# Patient Record
Sex: Female | Born: 1980 | Race: Black or African American | Hispanic: No | Marital: Married | Smoking: Current every day smoker
Health system: Southern US, Community
[De-identification: ages and names within clinical notes are randomized; demographics above are authoritative.]

## PROBLEM LIST (undated history)

## (undated) DIAGNOSIS — N83209 Unspecified ovarian cyst, unspecified side: Secondary | ICD-10-CM

## (undated) DIAGNOSIS — Z9851 Tubal ligation status: Secondary | ICD-10-CM

## (undated) DIAGNOSIS — R51 Headache: Secondary | ICD-10-CM

## (undated) DIAGNOSIS — O1490 Unspecified pre-eclampsia, unspecified trimester: Secondary | ICD-10-CM

## (undated) DIAGNOSIS — IMO0002 Reserved for concepts with insufficient information to code with codable children: Secondary | ICD-10-CM

## (undated) DIAGNOSIS — R87619 Unspecified abnormal cytological findings in specimens from cervix uteri: Secondary | ICD-10-CM

## (undated) HISTORY — DX: Unspecified abnormal cytological findings in specimens from cervix uteri: R87.619

## (undated) HISTORY — DX: Unspecified ovarian cyst, unspecified side: N83.209

## (undated) HISTORY — DX: Headache: R51

## (undated) HISTORY — DX: Reserved for concepts with insufficient information to code with codable children: IMO0002

---
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
## 1998-01-01 ENCOUNTER — Inpatient Hospital Stay (HOSPITAL_COMMUNITY): Admission: AD | Admit: 1998-01-01 | Discharge: 1998-01-01 | Payer: Self-pay | Admitting: Obstetrics & Gynecology

## 1998-05-09 ENCOUNTER — Inpatient Hospital Stay (HOSPITAL_COMMUNITY): Admission: AD | Admit: 1998-05-09 | Discharge: 1998-05-09 | Payer: Self-pay | Admitting: Obstetrics

## 1998-08-08 ENCOUNTER — Ambulatory Visit (HOSPITAL_COMMUNITY): Admission: RE | Admit: 1998-08-08 | Discharge: 1998-08-08 | Payer: Self-pay | Admitting: *Deleted

## 1998-10-13 ENCOUNTER — Inpatient Hospital Stay (HOSPITAL_COMMUNITY): Admission: AD | Admit: 1998-10-13 | Discharge: 1998-10-13 | Payer: Self-pay | Admitting: *Deleted

## 1998-10-15 ENCOUNTER — Inpatient Hospital Stay (HOSPITAL_COMMUNITY): Admission: AD | Admit: 1998-10-15 | Discharge: 1998-10-15 | Payer: Self-pay | Admitting: Obstetrics

## 1998-10-31 ENCOUNTER — Inpatient Hospital Stay (HOSPITAL_COMMUNITY): Admission: AD | Admit: 1998-10-31 | Discharge: 1998-10-31 | Payer: Self-pay | Admitting: Obstetrics

## 1998-11-21 ENCOUNTER — Inpatient Hospital Stay (HOSPITAL_COMMUNITY): Admission: AD | Admit: 1998-11-21 | Discharge: 1998-11-21 | Payer: Self-pay | Admitting: *Deleted

## 1998-12-17 ENCOUNTER — Inpatient Hospital Stay (HOSPITAL_COMMUNITY): Admission: AD | Admit: 1998-12-17 | Discharge: 1998-12-17 | Payer: Self-pay | Admitting: Obstetrics

## 1998-12-20 ENCOUNTER — Inpatient Hospital Stay (HOSPITAL_COMMUNITY): Admission: AD | Admit: 1998-12-20 | Discharge: 1998-12-23 | Payer: Self-pay | Admitting: Obstetrics

## 1998-12-26 ENCOUNTER — Inpatient Hospital Stay (HOSPITAL_COMMUNITY): Admission: AD | Admit: 1998-12-26 | Discharge: 1998-12-28 | Payer: Self-pay | Admitting: Obstetrics

## 1998-12-26 ENCOUNTER — Inpatient Hospital Stay (HOSPITAL_COMMUNITY): Admission: AD | Admit: 1998-12-26 | Discharge: 1998-12-26 | Payer: Self-pay | Admitting: Obstetrics

## 1999-04-23 ENCOUNTER — Encounter: Payer: Self-pay | Admitting: Emergency Medicine

## 1999-04-23 ENCOUNTER — Emergency Department (HOSPITAL_COMMUNITY): Admission: EM | Admit: 1999-04-23 | Discharge: 1999-04-23 | Payer: Self-pay | Admitting: Emergency Medicine

## 1999-11-20 ENCOUNTER — Ambulatory Visit (HOSPITAL_COMMUNITY): Admission: RE | Admit: 1999-11-20 | Discharge: 1999-11-20 | Payer: Self-pay | Admitting: *Deleted

## 1999-11-20 ENCOUNTER — Encounter: Payer: Self-pay | Admitting: *Deleted

## 1999-12-01 ENCOUNTER — Inpatient Hospital Stay (HOSPITAL_COMMUNITY): Admission: AD | Admit: 1999-12-01 | Discharge: 1999-12-01 | Payer: Self-pay | Admitting: Obstetrics

## 2000-01-10 ENCOUNTER — Ambulatory Visit (HOSPITAL_COMMUNITY): Admission: RE | Admit: 2000-01-10 | Discharge: 2000-01-10 | Payer: Self-pay | Admitting: Obstetrics

## 2000-04-02 ENCOUNTER — Inpatient Hospital Stay (HOSPITAL_COMMUNITY): Admission: AD | Admit: 2000-04-02 | Discharge: 2000-04-02 | Payer: Self-pay | Admitting: Obstetrics

## 2000-04-11 ENCOUNTER — Emergency Department (HOSPITAL_COMMUNITY): Admission: EM | Admit: 2000-04-11 | Discharge: 2000-04-12 | Payer: Self-pay

## 2000-05-21 ENCOUNTER — Inpatient Hospital Stay (HOSPITAL_COMMUNITY): Admission: AD | Admit: 2000-05-21 | Discharge: 2000-05-21 | Payer: Self-pay | Admitting: *Deleted

## 2000-05-27 ENCOUNTER — Inpatient Hospital Stay (HOSPITAL_COMMUNITY): Admission: AD | Admit: 2000-05-27 | Discharge: 2000-05-29 | Payer: Self-pay | Admitting: *Deleted

## 2000-08-13 ENCOUNTER — Inpatient Hospital Stay (HOSPITAL_COMMUNITY): Admission: AD | Admit: 2000-08-13 | Discharge: 2000-08-13 | Payer: Self-pay | Admitting: Obstetrics & Gynecology

## 2001-02-03 ENCOUNTER — Inpatient Hospital Stay (HOSPITAL_COMMUNITY): Admission: AD | Admit: 2001-02-03 | Discharge: 2001-02-03 | Payer: Self-pay | Admitting: Obstetrics

## 2001-04-02 ENCOUNTER — Emergency Department (HOSPITAL_COMMUNITY): Admission: EM | Admit: 2001-04-02 | Discharge: 2001-04-02 | Payer: Self-pay | Admitting: Emergency Medicine

## 2002-03-08 ENCOUNTER — Inpatient Hospital Stay (HOSPITAL_COMMUNITY): Admission: AD | Admit: 2002-03-08 | Discharge: 2002-03-08 | Payer: Self-pay | Admitting: *Deleted

## 2002-08-10 ENCOUNTER — Inpatient Hospital Stay (HOSPITAL_COMMUNITY): Admission: AD | Admit: 2002-08-10 | Discharge: 2002-08-10 | Payer: Self-pay | Admitting: Obstetrics and Gynecology

## 2002-08-16 ENCOUNTER — Inpatient Hospital Stay (HOSPITAL_COMMUNITY): Admission: AD | Admit: 2002-08-16 | Discharge: 2002-08-16 | Payer: Self-pay | Admitting: *Deleted

## 2002-08-16 ENCOUNTER — Encounter: Payer: Self-pay | Admitting: *Deleted

## 2002-08-17 ENCOUNTER — Encounter (INDEPENDENT_AMBULATORY_CARE_PROVIDER_SITE_OTHER): Payer: Self-pay | Admitting: Specialist

## 2002-08-17 ENCOUNTER — Ambulatory Visit (HOSPITAL_COMMUNITY): Admission: RE | Admit: 2002-08-17 | Discharge: 2002-08-17 | Payer: Self-pay | Admitting: Obstetrics and Gynecology

## 2002-08-20 ENCOUNTER — Inpatient Hospital Stay (HOSPITAL_COMMUNITY): Admission: AD | Admit: 2002-08-20 | Discharge: 2002-08-20 | Payer: Self-pay | Admitting: *Deleted

## 2003-09-12 ENCOUNTER — Inpatient Hospital Stay (HOSPITAL_COMMUNITY): Admission: AD | Admit: 2003-09-12 | Discharge: 2003-09-12 | Payer: Self-pay | Admitting: *Deleted

## 2004-08-03 ENCOUNTER — Inpatient Hospital Stay (HOSPITAL_COMMUNITY): Admission: AD | Admit: 2004-08-03 | Discharge: 2004-08-03 | Payer: Self-pay | Admitting: Obstetrics and Gynecology

## 2006-09-16 ENCOUNTER — Ambulatory Visit (HOSPITAL_COMMUNITY): Admission: RE | Admit: 2006-09-16 | Discharge: 2006-09-16 | Payer: Self-pay | Admitting: Obstetrics

## 2006-10-24 ENCOUNTER — Inpatient Hospital Stay (HOSPITAL_COMMUNITY): Admission: AD | Admit: 2006-10-24 | Discharge: 2006-10-24 | Payer: Self-pay | Admitting: Obstetrics & Gynecology

## 2007-01-01 ENCOUNTER — Ambulatory Visit (HOSPITAL_COMMUNITY): Admission: RE | Admit: 2007-01-01 | Discharge: 2007-01-01 | Payer: Self-pay | Admitting: Obstetrics

## 2007-01-14 ENCOUNTER — Inpatient Hospital Stay (HOSPITAL_COMMUNITY): Admission: AD | Admit: 2007-01-14 | Discharge: 2007-01-14 | Payer: Self-pay | Admitting: Obstetrics & Gynecology

## 2007-03-08 ENCOUNTER — Inpatient Hospital Stay (HOSPITAL_COMMUNITY): Admission: RE | Admit: 2007-03-08 | Discharge: 2007-03-10 | Payer: Self-pay | Admitting: Obstetrics & Gynecology

## 2007-06-26 ENCOUNTER — Inpatient Hospital Stay (HOSPITAL_COMMUNITY): Admission: AD | Admit: 2007-06-26 | Discharge: 2007-06-27 | Payer: Self-pay | Admitting: Obstetrics

## 2007-08-19 ENCOUNTER — Inpatient Hospital Stay (HOSPITAL_COMMUNITY): Admission: AD | Admit: 2007-08-19 | Discharge: 2007-08-19 | Payer: Self-pay | Admitting: Obstetrics & Gynecology

## 2007-09-20 ENCOUNTER — Emergency Department (HOSPITAL_COMMUNITY): Admission: EM | Admit: 2007-09-20 | Discharge: 2007-09-20 | Payer: Self-pay | Admitting: Emergency Medicine

## 2007-11-17 ENCOUNTER — Emergency Department (HOSPITAL_COMMUNITY): Admission: EM | Admit: 2007-11-17 | Discharge: 2007-11-17 | Payer: Self-pay | Admitting: Podiatry

## 2008-08-02 ENCOUNTER — Emergency Department (HOSPITAL_COMMUNITY): Admission: EM | Admit: 2008-08-02 | Discharge: 2008-08-02 | Payer: Self-pay | Admitting: Emergency Medicine

## 2008-09-21 ENCOUNTER — Inpatient Hospital Stay (HOSPITAL_COMMUNITY): Admission: AD | Admit: 2008-09-21 | Discharge: 2008-09-22 | Payer: Self-pay | Admitting: Obstetrics & Gynecology

## 2008-10-19 ENCOUNTER — Emergency Department (HOSPITAL_COMMUNITY): Admission: EM | Admit: 2008-10-19 | Discharge: 2008-10-19 | Payer: Self-pay | Admitting: Emergency Medicine

## 2008-11-05 IMAGING — US US TRANSVAGINAL NON-OB
1 series · 13 of 25 positions shown · non-contrast
Comparison: none

CLINICAL DATA: Abdominal and pelvic pain.  LMP 08/02/07.
 TRANSABDOMINAL AND TRANSVAGINAL PELVIC ULTRASOUND:
TECHNIQUE: Both transabdominal and transvaginal ultrasound examinations of the pelvis were performed including evaluation of the uterus, ovaries, adnexal regions, and pelvic cul-de-sac.

[Series 1: us transvaginal non-ob · 0.21mm/px · 13 of 56 slices shown]
[im 1/56]
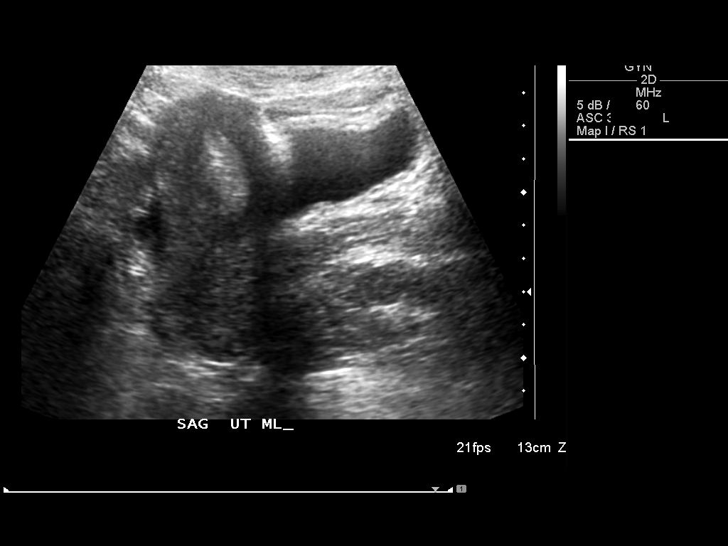
[im 5/56]
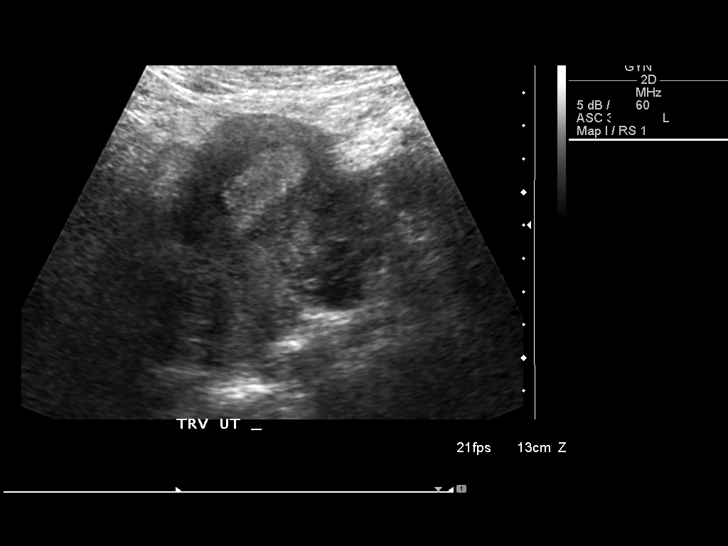
[im 10/56]
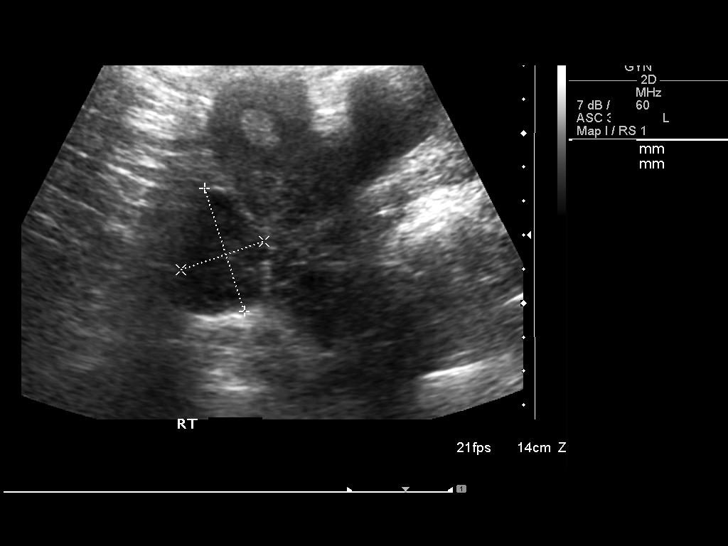
[im 14/56]
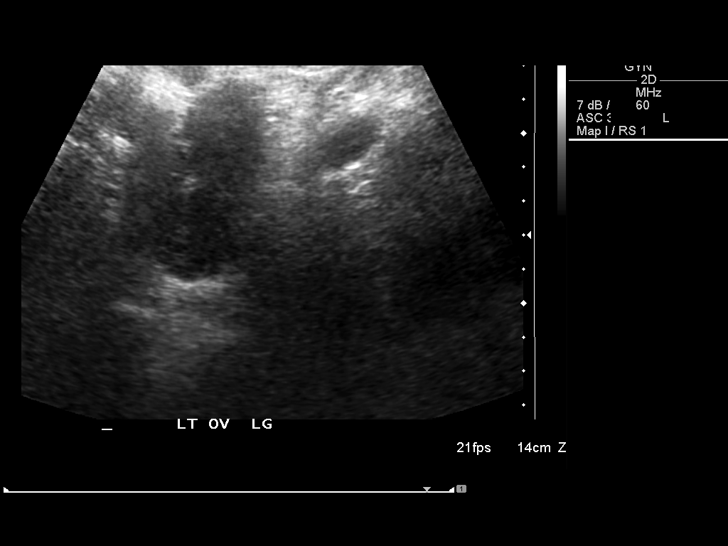
[im 19/56]
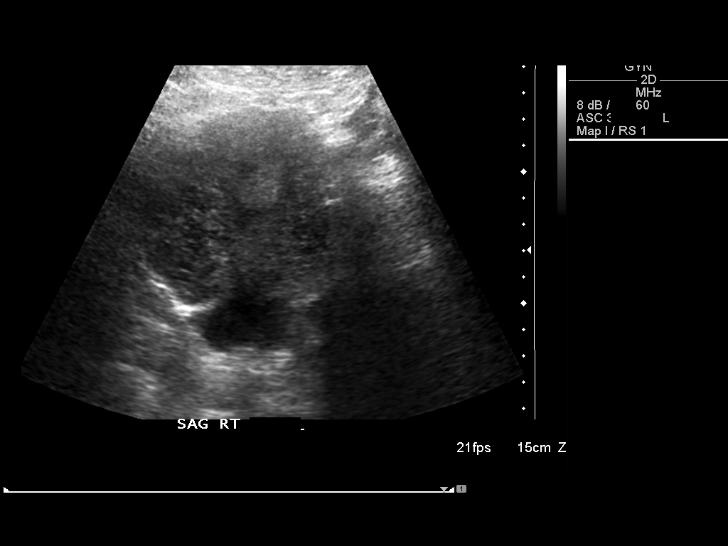
[im 23/56]
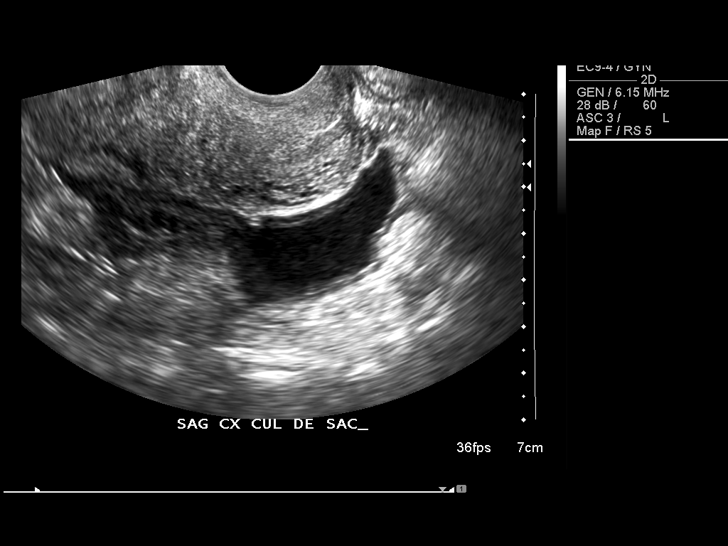
[im 28/56]
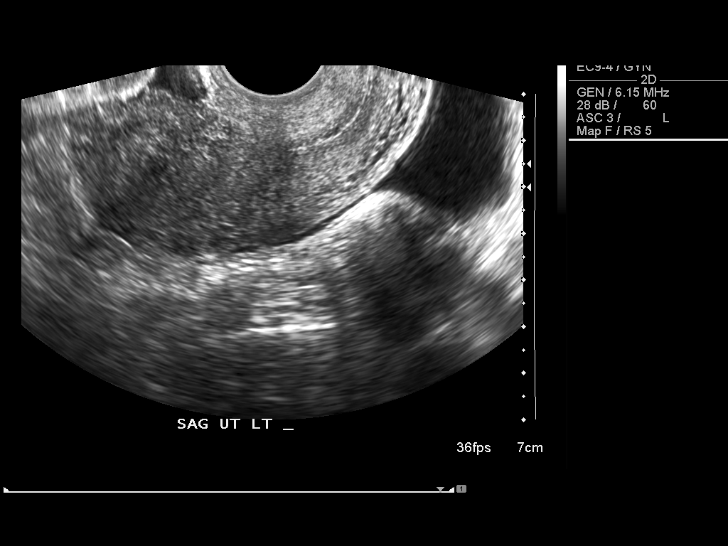
[im 33/56]
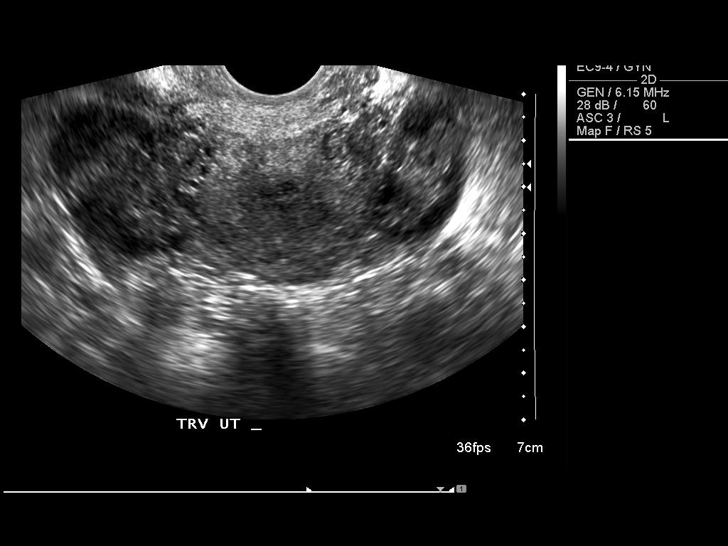
[im 37/56]
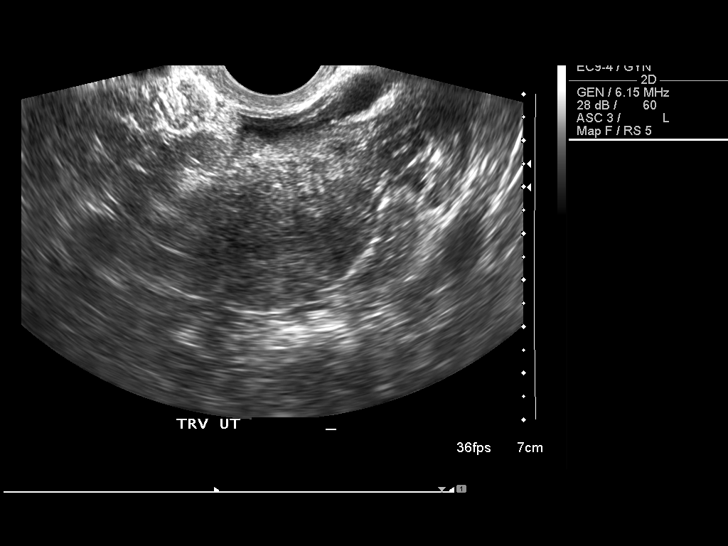
[im 42/56]
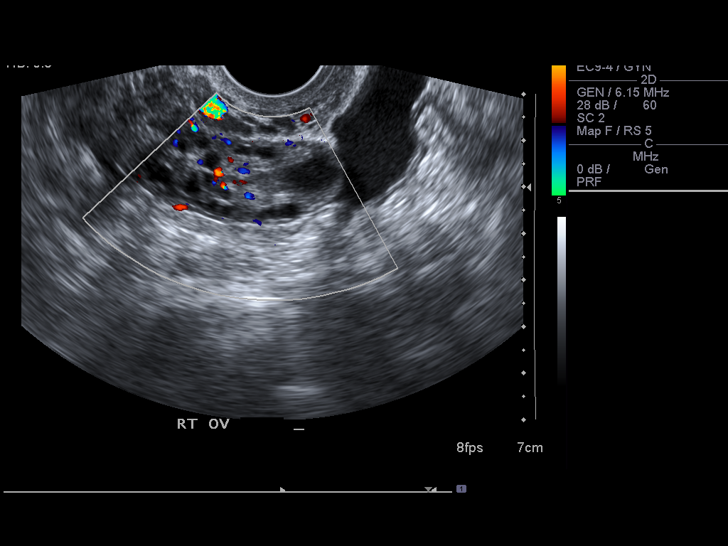
[im 46/56]
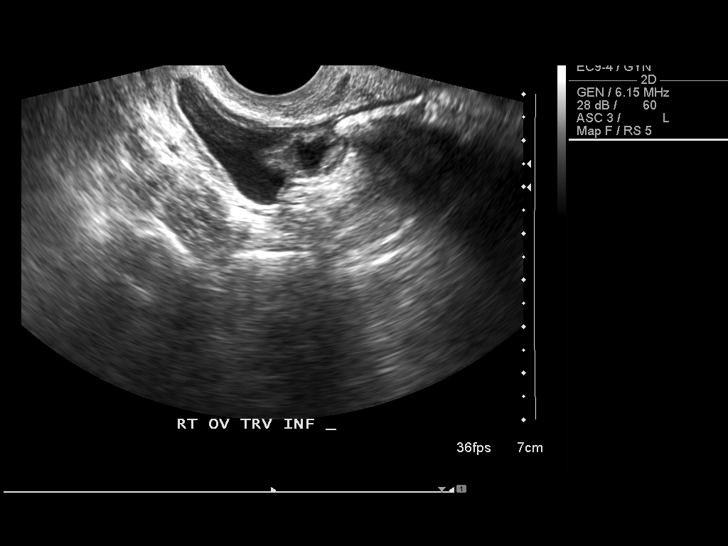
[im 51/56]
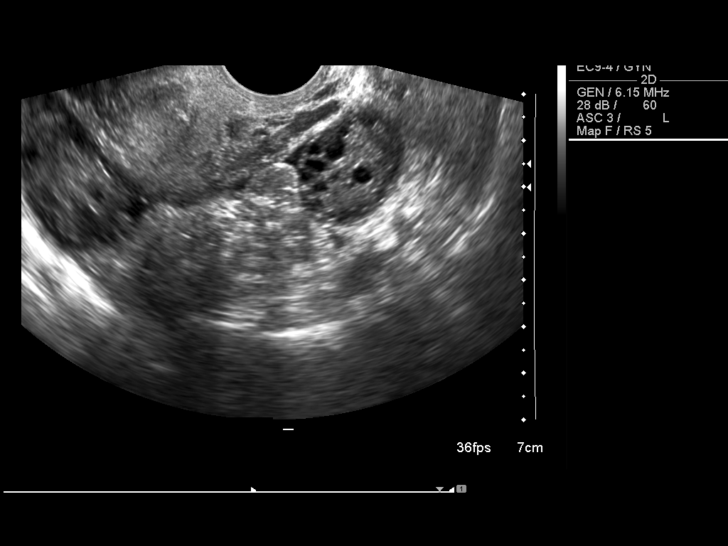
[im 56/56]
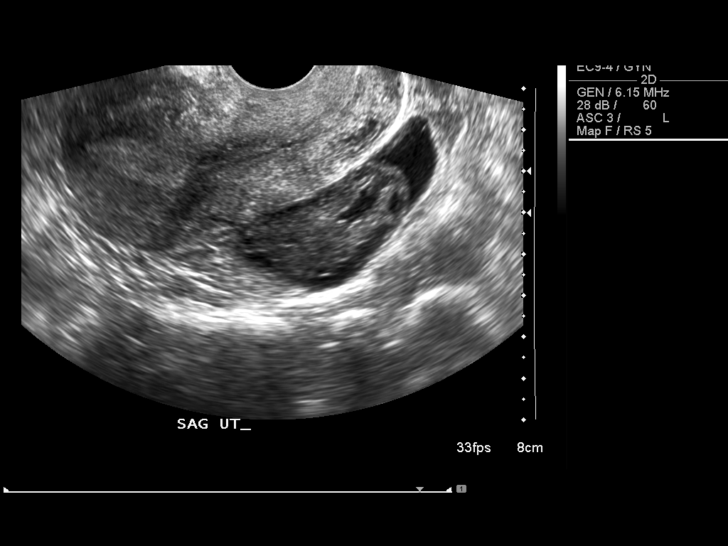

[13 of 25 positions shown; findings below may reference images not displayed]

FINDINGS: The uterus has a sagittal length of 8.5 cm, an AP width of 3.5 cm, and a transverse width of 4.9 cm.  A homogeneous uterine myometrium is seen.  The endometrial stripe is trilayered with an AP width of 1.0 cm.  No areas of focal thickening or inhomogeneity are noted and this would correlate with a periovulatory endometrial stripe and the patient?s given LMP of 08/19/07.  
 The right ovary measures 4.6 x 2.4 x 3.2 cm and contains a collapsing corpus luteum cyst.  There is a small amount of complex fluid identified adjacent to the right ovary suggesting that there may have been rupture of this corpus luteum cyst with some associated hemoperitoneum.  
 The left ovary has a normal appearance measuring 3.4 x 1.9 x 2.3 cm.  No separate adnexal masses are noted.
IMPRESSION: Normal periovulatory uterine myometrium, endometrium, and ovaries.  Small amount of complex free fluid is identified in a right paraovarian position extending to the cul-de-sac and given the presence of a collapsing corpus luteum cyst may represent some leakage or rupture from this cyst in light of the patient?s history of pain.
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
## 2009-02-05 ENCOUNTER — Inpatient Hospital Stay (HOSPITAL_COMMUNITY): Admission: AD | Admit: 2009-02-05 | Discharge: 2009-02-05 | Payer: Self-pay | Admitting: Obstetrics & Gynecology

## 2009-09-10 ENCOUNTER — Inpatient Hospital Stay (HOSPITAL_COMMUNITY): Admission: AD | Admit: 2009-09-10 | Discharge: 2009-09-10 | Payer: Self-pay | Admitting: Obstetrics & Gynecology

## 2009-09-24 ENCOUNTER — Inpatient Hospital Stay (HOSPITAL_COMMUNITY): Admission: AD | Admit: 2009-09-24 | Discharge: 2009-09-24 | Payer: Self-pay | Admitting: Family Medicine

## 2010-07-29 ENCOUNTER — Emergency Department (HOSPITAL_COMMUNITY): Admission: EM | Admit: 2010-07-29 | Discharge: 2010-07-29 | Payer: Self-pay | Admitting: Emergency Medicine

## 2011-02-19 LAB — URINALYSIS, ROUTINE W REFLEX MICROSCOPIC
Bilirubin Urine: NEGATIVE
Glucose, UA: NEGATIVE mg/dL
Hgb urine dipstick: NEGATIVE
Ketones, ur: NEGATIVE mg/dL
Protein, ur: NEGATIVE mg/dL
Specific Gravity, Urine: 1.01 (ref 1.005–1.030)
Urobilinogen, UA: 0.2 mg/dL (ref 0.0–1.0)
pH: 7.5 (ref 5.0–8.0)

## 2011-02-19 LAB — WET PREP, GENITAL: Trich, Wet Prep: NONE SEEN

## 2011-02-20 LAB — WET PREP, GENITAL: Trich, Wet Prep: NONE SEEN

## 2011-02-27 LAB — URINALYSIS, ROUTINE W REFLEX MICROSCOPIC
Nitrite: NEGATIVE
Protein, ur: NEGATIVE mg/dL
Specific Gravity, Urine: 1.025 (ref 1.005–1.030)
Urobilinogen, UA: 1 mg/dL (ref 0.0–1.0)

## 2011-02-27 LAB — WET PREP, GENITAL
Clue Cells Wet Prep HPF POC: NONE SEEN
Trich, Wet Prep: NONE SEEN

## 2011-04-04 NOTE — Op Note (Signed)
   Stacey Sparks, Stacey Sparks                       ACCOUNT NO.:  [id number]   MEDICAL RECORD NO.:  [id number]                   PATIENT TYPE:  AMB   LOCATION:  SDC                                  FACILITY:  [facility]   PHYSICIAN:  Sherry A. Rosalio Macadamia, M.D.           DATE OF BIRTH:  03/10/1981   DATE OF PROCEDURE:  08/17/2002  DATE OF DISCHARGE:                                 OPERATIVE REPORT   PREOPERATIVE DIAGNOSES:  Missed abortion.   POSTOPERATIVE DIAGNOSES:  Missed abortion.   PROCEDURE:  Dictation ended at this point.                                               Sherry A. Rosalio Macadamia, M.D.    SAD/MEDQ  D:  08/17/2002  T:  08/17/2002  Job:  [id number]

## 2011-04-04 NOTE — H&P (Signed)
NAMECINDEE, Stacey Sparks             ACCOUNT NO.:  [id number]   MEDICAL RECORD NO.:  [id number]          PATIENT TYPE:  INP   LOCATION:  9174                          FACILITY:  [facility]   PHYSICIAN:  Roseanna Rainbow, M.D.DATE OF BIRTH:  [birth year]   DATE OF ADMISSION:  03/08/2007  DATE OF DISCHARGE:                              HISTORY & PHYSICAL   CHIEF COMPLAINT:  The patient is a gravida 6, para 2, with an estimated  date of confinement of March 02, 2007, with an intrauterine pregnancy at  41 weeks, for induction of labor secondary to post dates.   HISTORY OF PRESENT ILLNESS:  Please note, the patient reports regular  contractions for several hours.   ALLERGIES:  No known drug allergies.   OB RISK FACTORS:  None.   PRENATAL LABS:  Hemoglobin 12.4, hematocrit 38.2, platelets 291,000.  Blood type O positive, antibody screen negative.  Sickle cell negative.  RPR nonreactive.  Hepatitis B surface antigen negative.  HIV  nonreactive.  Rubella immune.  Urine culture and sensitivity:  No  growth.  Pap smear negative.  GC probe negative.  Chlamydia probe  negative.  Quad screen normal.  One-hour GTT is 109.   PAST GYN HISTORY:  Noncontributory.   PAST MEDICAL HISTORY:  No significant history of medical disease.   PAST SURGICAL HISTORY:  Orthopedic procedure.   SOCIAL HISTORY:  She is employed at [employer] work.  Marital history:  She is single.  Not using alcohol currently.  Previous smoked.  Denies  illicit drug use.   FAMILY HISTORY:  Adult-onset diabetes, hypertension.   PAST OBSTETRICAL HISTORY:  In June 2005, she had a voluntary termination  of pregnancy.  In July 2001, she had a full-term vaginal delivery of  female 7 pounds 6 ounces.  In February 2000, she was delivered of a  liveborn female 7 pounds 8 ounces vaginal delivery, complicated by  preeclampsia.  She has a history of 2 spontaneous abortions.   PHYSICAL EXAMINATION:  VITAL SIGNS:  Stable, afebrile.  Fetal  heart  tracing reassuring.  Tocodynamometer:  Regular uterine contractions.  PELVIC:  Sterile vaginal exam:  The cervix is 4 cm dilated, 80% effaced.  The membranes were artificially ruptured for clear fluid.   ASSESSMENT:  Multipara at 41+ weeks, late in labor.  Fetal heart tracing  consistent with fetal wellbeing.   PLAN:  Admit.  Continue augmentation of labor.      Roseanna Rainbow, M.D.  Electronically Signed     LAJ/MEDQ  D:  03/08/2007  T:  03/08/2007  Job:  [id number]

## 2011-04-04 NOTE — Op Note (Signed)
[name], Stacey Sparks                       ACCOUNT NO.:  [id number]   MEDICAL RECORD NO.:  [id number]                   PATIENT TYPE:  AMB   LOCATION:  SDC                                  FACILITY:  [facility]   PHYSICIAN:  Sherry A. Rosalio Macadamia, M.D.           DATE OF BIRTH:  [birth year]   DATE OF PROCEDURE:  08/17/2002  DATE OF DISCHARGE:                                 OPERATIVE REPORT   PREOPERATIVE DIAGNOSES:  Missed abortion.   POSTOPERATIVE DIAGNOSES:  Missed abortion.   PROCEDURE:  Dilatation and evacuation.   SURGEON:  Sherry A. Rosalio Macadamia, M.D.   ANESTHESIA:  MAC.   INDICATIONS:  This is a 30 year old G5, P2-0-2-2 woman whose last menstrual  period was approximately July 28.  The patient was not using any form of  contraception.  The patient noted onset of bleeding and cramping on  September 30.  The patient had not passed any tissue.  She was seen in the  emergency room at [facility].  Ultrasound was performed which revealed  a 6 week sac that was irregular with no fetal pole, but with a yolk sac  present.  Therefore, diagnosis of missed abortion was made.  The patient was  given a choice of attempting to pass tissue on her own or having a surgery.  She elected to go home to try to pass it on her own; however, her cramping  continued with no passage of tissue.  The patient had been scheduled for  surgery today if she did not complete the miscarriage on her own.  Therefore, the patient is now admitted to the operating room for D&E.   FINDINGS:  A 6-7 week sized anteflexed uterus with no adnexal masses.  Cervix was dilated approximately 1 cm.   PROCEDURE:  The patient was brought into the operating room and given  adequate IV sedation.  She was placed in a dorsal lithotomy position.  Pelvic examination was performed.  The patient's perineum was washed with  Hibiclens.  Bladder was I&O catheterized.  Surgeon's gloves were changed.  The patient was draped in a  sterile fashion.  Speculum was placed within the  vagina.  Vagina was washed with Hibiclens.  Paracervical block was  administered with 1% Nesacaine.  Anterior lip of the cervix was grasped with  a single tooth tenaculum.  Cervix was sounded.  No dilating was necessary.  A 7 mm curved curette was easily introduced into the endometrial cavity.  Suction was applied.  Suction was continued with products of conception  obtained.  This was continued until adequate hemostasis was present.  Just a  very brief, sharp curettage was performed with no tissue present.  Suction  was reapplied.  Adequate hemostasis was present.  All instruments removed  from vagina.  The patient was taken out of the dorsal lithotomy position.  She was awakened.  She was moved from the operating table to a stretcher in  stable condition.   COMPLICATIONS:  None.   ESTIMATED BLOOD LOSS:  Less than 5 cc.                                              Sherry A. Rosalio Macadamia, M.D.   SAD/MEDQ  D:  08/17/2002  T:  08/17/2002  Job:  [id number]

## 2011-06-17 ENCOUNTER — Other Ambulatory Visit (HOSPITAL_COMMUNITY): Payer: Self-pay | Admitting: Obstetrics and Gynecology

## 2011-06-17 DIAGNOSIS — O3680X0 Pregnancy with inconclusive fetal viability, not applicable or unspecified: Secondary | ICD-10-CM

## 2011-06-20 ENCOUNTER — Ambulatory Visit (HOSPITAL_COMMUNITY)
Admission: RE | Admit: 2011-06-20 | Discharge: 2011-06-20 | Disposition: A | Discharge status: Home / Self Care | Payer: Medicaid Other | Source: Ambulatory Visit | Attending: Obstetrics and Gynecology | Admitting: Obstetrics and Gynecology

## 2011-06-20 DIAGNOSIS — O3680X0 Pregnancy with inconclusive fetal viability, not applicable or unspecified: Secondary | ICD-10-CM

## 2011-06-20 DIAGNOSIS — Z3689 Encounter for other specified antenatal screening: Secondary | ICD-10-CM | POA: Insufficient documentation

## 2011-07-09 LAB — ANTIBODY SCREEN: Antibody Screen: NEGATIVE

## 2011-07-09 LAB — ABO/RH: RH Type: POSITIVE

## 2011-07-09 LAB — RUBELLA ANTIBODY, IGM: Rubella: IMMUNE

## 2011-07-09 LAB — RPR: RPR: NONREACTIVE

## 2011-07-09 LAB — GC/CHLAMYDIA PROBE AMP, GENITAL: Chlamydia: NEGATIVE

## 2011-07-09 LAB — HEPATITIS B SURFACE ANTIGEN: Hepatitis B Surface Ag: NEGATIVE

## 2011-08-18 LAB — CBC
HCT: 35.9 — ABNORMAL LOW
Hemoglobin: 12.3
MCV: 101.2 — ABNORMAL HIGH
RBC: 3.55 — ABNORMAL LOW

## 2011-08-18 LAB — URINALYSIS, ROUTINE W REFLEX MICROSCOPIC
Bilirubin Urine: NEGATIVE
Ketones, ur: NEGATIVE
Nitrite: NEGATIVE
Protein, ur: NEGATIVE
Specific Gravity, Urine: 1.032 — ABNORMAL HIGH
Urobilinogen, UA: 1

## 2011-08-18 LAB — DIFFERENTIAL
Lymphocytes Relative: 37
Lymphs Abs: 1.8
Monocytes Absolute: 0.6
Neutro Abs: 2.1

## 2011-08-18 LAB — COMPREHENSIVE METABOLIC PANEL
ALT: 21
AST: 19
Albumin: 3.7
Creatinine, Ser: 0.9
GFR calc Af Amer: >60
Glucose, Bld: 84
Potassium: 3.9
Sodium: 140
Total Bilirubin: 0.6

## 2011-08-20 LAB — URINALYSIS, ROUTINE W REFLEX MICROSCOPIC
Bilirubin Urine: NEGATIVE
Glucose, UA: 250 — AB
Ketones, ur: 15 — AB
Nitrite: POSITIVE — AB
Protein, ur: >300 — AB
Specific Gravity, Urine: 1.025

## 2011-08-20 LAB — POCT PREGNANCY, URINE: Preg Test, Ur: NEGATIVE

## 2011-08-20 LAB — WET PREP, GENITAL
Clue Cells Wet Prep HPF POC: NONE SEEN
Trich, Wet Prep: NONE SEEN
Yeast Wet Prep HPF POC: NONE SEEN

## 2011-08-28 LAB — URINALYSIS, ROUTINE W REFLEX MICROSCOPIC
Bilirubin Urine: NEGATIVE
Glucose, UA: NEGATIVE
Hgb urine dipstick: NEGATIVE
Ketones, ur: NEGATIVE
Protein, ur: NEGATIVE
pH: 5.5

## 2011-08-28 LAB — WET PREP, GENITAL
Clue Cells Wet Prep HPF POC: NONE SEEN
Trich, Wet Prep: NONE SEEN
Yeast Wet Prep HPF POC: NONE SEEN

## 2011-08-28 LAB — CBC
HCT: 35.4 — ABNORMAL LOW
Hemoglobin: 12.2
MCHC: 34.5
RBC: 3.58 — ABNORMAL LOW

## 2011-08-28 LAB — GC/CHLAMYDIA PROBE AMP, GENITAL
Chlamydia, DNA Probe: NEGATIVE
GC Probe Amp, Genital: NEGATIVE

## 2011-08-28 LAB — HERPES SIMPLEX VIRUS CULTURE: Culture: NOT DETECTED

## 2011-09-01 LAB — GC/CHLAMYDIA PROBE AMP, GENITAL
Chlamydia, DNA Probe: NEGATIVE
GC Probe Amp, Genital: NEGATIVE

## 2011-09-01 LAB — URINALYSIS, ROUTINE W REFLEX MICROSCOPIC
Glucose, UA: NEGATIVE
Hgb urine dipstick: NEGATIVE
Protein, ur: NEGATIVE

## 2011-09-01 LAB — WET PREP, GENITAL
Clue Cells Wet Prep HPF POC: NONE SEEN
Trich, Wet Prep: NONE SEEN

## 2011-09-01 LAB — POCT PREGNANCY, URINE: Operator id: 202651

## 2011-11-18 NOTE — L&D Delivery Note (Signed)
Delivery Note At 7:38 PM a viable female was delivered via Vaginal, Spontaneous Delivery (Presentation:OA compound with hand).  APGAR: 8, 9; weight 8 lb 2.3 oz (3694 g).   Placenta status: Intact, Spontaneous.  Cord: 3 vessels with the following complications: Nuchal.    Anesthesia: Epidural  Episiotomy: None Lacerations: B labial, R repaired, L hemostatic Suture Repair: 3.0 vicryl rapide Est. Blood Loss (mL): 500 L labial skin tag excised with scalpel, single suture at base for hemostasis, sent to pathology  Mom to postpartum.  Baby to nursery-stable.  Sparks,Stacey Sperbeck 01/23/2012, 8:07 PM   O+/Bo/RI Desires circumcision for female infant, d/w pt r/b/a of procedure, wish to proceed at office. Pt desires PPBTL, d/w pt other reversible forms of contraception, pt desires.  D/w pt r/b/a will proceed in am, d/w pt increased risk of ectopic pregnancy with failure 1/200

## 2011-12-29 LAB — STREP B DNA PROBE: GBS: POSITIVE

## 2012-01-21 ENCOUNTER — Encounter (HOSPITAL_COMMUNITY): Payer: Self-pay | Admitting: *Deleted

## 2012-01-21 ENCOUNTER — Telehealth (HOSPITAL_COMMUNITY): Payer: Self-pay | Admitting: *Deleted

## 2012-01-21 NOTE — Telephone Encounter (Signed)
Preadmission screen  

## 2012-01-22 ENCOUNTER — Other Ambulatory Visit: Payer: Self-pay | Admitting: Obstetrics and Gynecology
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
## 2012-01-23 ENCOUNTER — Encounter (HOSPITAL_COMMUNITY): Payer: Self-pay

## 2012-01-23 ENCOUNTER — Inpatient Hospital Stay (HOSPITAL_COMMUNITY)
Admission: RE | Admit: 2012-01-23 | Discharge: 2012-01-25 | DRG: 775 | Disposition: A | Discharge status: Home / Self Care | Payer: Medicaid Other | Source: Ambulatory Visit | Attending: Obstetrics and Gynecology | Admitting: Obstetrics and Gynecology

## 2012-01-23 DIAGNOSIS — O99892 Other specified diseases and conditions complicating childbirth: Secondary | ICD-10-CM | POA: Diagnosis present

## 2012-01-23 DIAGNOSIS — Z9851 Tubal ligation status: Secondary | ICD-10-CM

## 2012-01-23 DIAGNOSIS — L919 Hypertrophic disorder of the skin, unspecified: Secondary | ICD-10-CM | POA: Diagnosis present

## 2012-01-23 DIAGNOSIS — L909 Atrophic disorder of skin, unspecified: Secondary | ICD-10-CM | POA: Diagnosis present

## 2012-01-23 HISTORY — DX: Tubal ligation status: Z98.51

## 2012-01-23 LAB — CBC
MCV: 102.3 fL — ABNORMAL HIGH (ref 78.0–100.0)
Platelets: 231 10*3/uL (ref 150–400)
RBC: 3.04 MIL/uL — ABNORMAL LOW (ref 3.87–5.11)
RDW: 14.2 % (ref 11.5–15.5)
WBC: 8.7 10*3/uL (ref 4.0–10.5)

## 2012-01-23 LAB — ABO/RH: ABO/RH(D): O POS

## 2012-01-23 MED ORDER — BUTORPHANOL TARTRATE 2 MG/ML IJ SOLN: 2.0000 mg | INTRAMUSCULAR | Status: DC | PRN

## 2012-01-23 MED ORDER — DIPHENHYDRAMINE HCL 25 MG PO CAPS: 25.0000 mg | ORAL_CAPSULE | Freq: Four times a day (QID) | ORAL | Status: DC | PRN

## 2012-01-23 MED ORDER — FENTANYL 2.5 MCG/ML BUPIVACAINE 1/10 % EPIDURAL INFUSION (WH - ANES)
INTRAMUSCULAR | Status: DC | PRN
  Administered 2012-01-23: 14 mL/h via EPIDURAL

## 2012-01-23 MED ORDER — OXYTOCIN BOLUS FROM INFUSION
500.0000 mL | Freq: Once | INTRAVENOUS | Status: DC
  Filled 2012-01-23: qty 500
  Filled 2012-01-23: qty 1000

## 2012-01-23 MED ORDER — LACTATED RINGERS IV SOLN
500.0000 mL | Freq: Once | INTRAVENOUS | Status: AC
  Administered 2012-01-23: 500 mL via INTRAVENOUS

## 2012-01-23 MED ORDER — CITRIC ACID-SODIUM CITRATE 334-500 MG/5ML PO SOLN: 30.0000 mL | ORAL | Status: DC | PRN

## 2012-01-23 MED ORDER — LACTATED RINGERS IV SOLN
500.0000 mL | INTRAVENOUS | Status: DC | PRN
  Administered 2012-01-23: 200 mL via INTRAVENOUS

## 2012-01-23 MED ORDER — LANOLIN HYDROUS EX OINT: TOPICAL_OINTMENT | CUTANEOUS | Status: DC | PRN

## 2012-01-23 MED ORDER — ZOLPIDEM TARTRATE 5 MG PO TABS
5.0000 mg | ORAL_TABLET | Freq: Every evening | ORAL | Status: DC | PRN
Start: 2012-01-23 — End: 2012-01-25

## 2012-01-23 MED ORDER — DIBUCAINE 1 % RE OINT: 1.0000 "application " | TOPICAL_OINTMENT | RECTAL | Status: DC | PRN

## 2012-01-23 MED ORDER — PENICILLIN G POTASSIUM 5000000 UNITS IJ SOLR
2.5000 10*6.[IU] | INTRAVENOUS | Status: DC
  Administered 2012-01-23 (×2): 2.5 10*6.[IU] via INTRAVENOUS
  Filled 2012-01-23 (×5): qty 2.5

## 2012-01-23 MED ORDER — IBUPROFEN 600 MG PO TABS
600.0000 mg | ORAL_TABLET | Freq: Four times a day (QID) | ORAL | Status: DC
  Administered 2012-01-24 – 2012-01-25 (×4): 600 mg via ORAL
  Filled 2012-01-23 (×4): qty 1

## 2012-01-23 MED ORDER — PHENYLEPHRINE 40 MCG/ML (10ML) SYRINGE FOR IV PUSH (FOR BLOOD PRESSURE SUPPORT)
80.0000 ug | PREFILLED_SYRINGE | INTRAVENOUS | Status: DC | PRN
  Administered 2012-01-23: 80 ug via INTRAVENOUS
  Filled 2012-01-23: qty 5

## 2012-01-23 MED ORDER — TETANUS-DIPHTH-ACELL PERTUSSIS 5-2.5-18.5 LF-MCG/0.5 IM SUSP
0.5000 mL | Freq: Once | INTRAMUSCULAR | Status: AC
  Administered 2012-01-25: 0.5 mL via INTRAMUSCULAR
  Filled 2012-01-23: qty 0.5

## 2012-01-23 MED ORDER — BENZOCAINE-MENTHOL 20-0.5 % EX AERO: 1.0000 "application " | INHALATION_SPRAY | CUTANEOUS | Status: DC | PRN

## 2012-01-23 MED ORDER — TERBUTALINE SULFATE 1 MG/ML IJ SOLN: 0.2500 mg | Freq: Once | INTRAMUSCULAR | Status: DC | PRN

## 2012-01-23 MED ORDER — LACTATED RINGERS IV SOLN
INTRAVENOUS | Status: DC
  Administered 2012-01-23 – 2012-01-24 (×2): via INTRAVENOUS

## 2012-01-23 MED ORDER — IBUPROFEN 600 MG PO TABS
600.0000 mg | ORAL_TABLET | Freq: Four times a day (QID) | ORAL | Status: DC | PRN
  Administered 2012-01-23: 600 mg via ORAL
  Filled 2012-01-23: qty 1

## 2012-01-23 MED ORDER — DIPHENHYDRAMINE HCL 50 MG/ML IJ SOLN: 12.5000 mg | INTRAMUSCULAR | Status: DC | PRN

## 2012-01-23 MED ORDER — FENTANYL 2.5 MCG/ML BUPIVACAINE 1/10 % EPIDURAL INFUSION (WH - ANES)
14.0000 mL/h | INTRAMUSCULAR | Status: DC
  Administered 2012-01-23 (×2): 14 mL/h via EPIDURAL
  Filled 2012-01-23 (×3): qty 60

## 2012-01-23 MED ORDER — OXYTOCIN 20 UNITS IN LACTATED RINGERS INFUSION - SIMPLE
125.0000 mL/h | Freq: Once | INTRAVENOUS | Status: AC
  Administered 2012-01-23: 125 mL/h via INTRAVENOUS

## 2012-01-23 MED ORDER — LACTATED RINGERS IV SOLN: INTRAVENOUS | Status: DC

## 2012-01-23 MED ORDER — OXYCODONE-ACETAMINOPHEN 5-325 MG PO TABS
1.0000 | ORAL_TABLET | ORAL | Status: DC | PRN
  Administered 2012-01-24 – 2012-01-25 (×4): 1 via ORAL
  Filled 2012-01-23 (×4): qty 1

## 2012-01-23 MED ORDER — FLEET ENEMA 7-19 GM/118ML RE ENEM: 1.0000 | ENEMA | RECTAL | Status: DC | PRN

## 2012-01-23 MED ORDER — EPHEDRINE 5 MG/ML INJ: 10.0000 mg | INTRAVENOUS | Status: DC | PRN

## 2012-01-23 MED ORDER — EPHEDRINE 5 MG/ML INJ
10.0000 mg | INTRAVENOUS | Status: DC | PRN
  Filled 2012-01-23: qty 4

## 2012-01-23 MED ORDER — DEXTROSE 5 % IV SOLN
5.0000 10*6.[IU] | Freq: Once | INTRAVENOUS | Status: AC
  Administered 2012-01-23: 5 10*6.[IU] via INTRAVENOUS
  Filled 2012-01-23: qty 5

## 2012-01-23 MED ORDER — CALCIUM CARBONATE ANTACID 500 MG PO CHEW: 1.0000 | CHEWABLE_TABLET | Freq: Two times a day (BID) | ORAL | Status: DC | PRN

## 2012-01-23 MED ORDER — ONDANSETRON HCL 4 MG/2ML IJ SOLN: 4.0000 mg | INTRAMUSCULAR | Status: DC | PRN

## 2012-01-23 MED ORDER — ACETAMINOPHEN 325 MG PO TABS: 650.0000 mg | ORAL_TABLET | ORAL | Status: DC | PRN

## 2012-01-23 MED ORDER — SODIUM BICARBONATE 8.4 % IV SOLN
INTRAVENOUS | Status: DC | PRN
  Administered 2012-01-23: 4 mL via EPIDURAL

## 2012-01-23 MED ORDER — PHENYLEPHRINE 40 MCG/ML (10ML) SYRINGE FOR IV PUSH (FOR BLOOD PRESSURE SUPPORT): 80.0000 ug | PREFILLED_SYRINGE | INTRAVENOUS | Status: DC | PRN

## 2012-01-23 MED ORDER — SENNOSIDES-DOCUSATE SODIUM 8.6-50 MG PO TABS
2.0000 | ORAL_TABLET | Freq: Every day | ORAL | Status: DC
  Administered 2012-01-23 – 2012-01-24 (×2): 2 via ORAL

## 2012-01-23 MED ORDER — LIDOCAINE HCL (PF) 1 % IJ SOLN
30.0000 mL | INTRAMUSCULAR | Status: DC | PRN
  Filled 2012-01-23: qty 30

## 2012-01-23 MED ORDER — LACTATED RINGERS IV SOLN
INTRAVENOUS | Status: DC
  Administered 2012-01-23: 08:00:00 via INTRAVENOUS

## 2012-01-23 MED ORDER — ONDANSETRON HCL 4 MG PO TABS: 4.0000 mg | ORAL_TABLET | ORAL | Status: DC | PRN

## 2012-01-23 MED ORDER — PRENATAL MULTIVITAMIN CH
1.0000 | ORAL_TABLET | Freq: Every day | ORAL | Status: DC
  Filled 2012-01-23: qty 1

## 2012-01-23 MED ORDER — OXYTOCIN 20 UNITS IN LACTATED RINGERS INFUSION - SIMPLE
1.0000 m[IU]/min | INTRAVENOUS | Status: DC
  Administered 2012-01-23: 2 m[IU]/min via INTRAVENOUS
  Filled 2012-01-23: qty 1000

## 2012-01-23 MED ORDER — WITCH HAZEL-GLYCERIN EX PADS: 1.0000 "application " | MEDICATED_PAD | CUTANEOUS | Status: DC | PRN

## 2012-01-23 MED ORDER — SIMETHICONE 80 MG PO CHEW: 80.0000 mg | CHEWABLE_TABLET | ORAL | Status: DC | PRN

## 2012-01-23 MED ORDER — OXYCODONE-ACETAMINOPHEN 5-325 MG PO TABS: 1.0000 | ORAL_TABLET | ORAL | Status: DC | PRN

## 2012-01-23 MED ORDER — ONDANSETRON HCL 4 MG/2ML IJ SOLN: 4.0000 mg | Freq: Four times a day (QID) | INTRAMUSCULAR | Status: DC | PRN

## 2012-01-23 NOTE — Anesthesia Preprocedure Evaluation (Addendum)

## 2012-01-23 NOTE — Progress Notes (Signed)
Stacey Sparks is a 31 y.o. [id number] at 39+ admitted for induction of labor due to Elective at term.  Subjective: comf with epidural  Objective: BP 119/61  Pulse 101  Temp(Src) 97.6 F (36.4 C) (Oral)  Resp 20  Ht 5\' 7"  (1.702 m)  Wt 97.07 kg (214 lb)  BMI 33.52 kg/m2  LMP 04/12/2011      FHT:  FHR: 140's bpm, variability: moderate,  accelerations:  Present,  decelerations:  Absent UC:   irregular, every 5-10 minutes SVE:   Dilation: 5.5 Effacement (%): 70 Station: -2 Exam by:: Dr. Ellyn Hack AROM for clear fluid, w/o diff/comp Labs: Lab Results  Component Value Date   WBC 8.7 01/23/2012   HGB 10.4* 01/23/2012   HCT 31.1* 01/23/2012   MCV 102.3* 01/23/2012   PLT 231 01/23/2012    Assessment / Plan: Induction of labor due to term with favorable cervix,  progressing well on pitocin  Labor: Progressing normally Preeclampsia:  no signs or symptoms of toxicity Fetal Wellbeing:  Category I Pain Control:  Epidural I/D:  n/a Anticipated MOD:  NSVD  BOVARD,Arlett Goold 01/23/2012, 2:00 PM

## 2012-01-23 NOTE — Progress Notes (Signed)
Pt to room 123 via WC in stable condition 

## 2012-01-23 NOTE — Progress Notes (Signed)
Stacey Sparks is a 31 y.o. [id number] at [redacted]w[redacted]d  admitted for induction of labor due to Elective at term.  Subjective: No c/o's, comf with epidural  Objective: BP 93/58  Pulse 76  Temp(Src) 98.1 F (36.7 C) (Oral)  Resp 20  Ht 5\' 7"  (1.702 m)  Wt 97.07 kg (214 lb)  BMI 33.52 kg/m2  LMP 04/12/2011      FHT:  FHR: 130's bpm, variability: moderate,  accelerations:  Present,  decelerations:  Present early decels UC:   regular, every 2-3 minutes SVE:   Dilation: 10 Effacement (%): 100 Station: +2 Exam by:: dr Ellyn Hack  Labs: Lab Results  Component Value Date   WBC 8.7 01/23/2012   HGB 10.4* 01/23/2012   HCT 31.1* 01/23/2012   MCV 102.3* 01/23/2012   PLT 231 01/23/2012    Assessment / Plan: Induction of labor due to term with favorable cervix,  progressing well on pitocin  Labor: Progressing normally Preeclampsia:  no signs or symptoms of toxicity Fetal Wellbeing:  Category I Pain Control:  Epidural I/D:  n/a Anticipated MOD:  NSVD, will start pushing soon  BOVARD,Dustyn Dansereau 01/23/2012, 6:40 PM

## 2012-01-23 NOTE — Anesthesia Procedure Notes (Signed)

## 2012-01-23 NOTE — Progress Notes (Signed)
Stacey Sparks is a 31 y.o. [id number] at [redacted]w[redacted]d admitted for induction of labor due to Elective at term.  Subjective: No c/o's  Objective: BP 116/67  Pulse 79  Temp(Src) 98.2 F (36.8 C) (Oral)  Resp 20  Ht 5\' 7"  (1.702 m)  Wt 97.07 kg (214 lb)  BMI 33.52 kg/m2  LMP 04/12/2011     gen NAD FHT:  FHR: 120-130 bpm, variability: moderate,  accelerations:  Present,  decelerations:  Absent UC:   irregular, every 10 minutes SVE:   Dilation: 4.5 Effacement (%): 50 Station: -2 Exam by:: Dr. Ellyn Hack Will start pitocin to augment  Labs: Lab Results  Component Value Date   WBC 4.9 08/02/2008   HGB 12.3 08/02/2008   HCT 35.9* 08/02/2008   MCV 101.2* 08/02/2008   PLT 267 08/02/2008    Assessment / Plan: Induction of labor due to term with favorable cervix  Labor: will start induction with pitocin, AROM after PCN Preeclampsia:  no signs or symptoms of toxicity Fetal Wellbeing:  Category I Pain Control:  Epidural I/D:  n/a Anticipated MOD:  NSVD  Pt desires skin tag removal and BTL PP  Stacey Sparks,Stacey Sparks 01/23/2012, 8:15 AM

## 2012-01-23 NOTE — Progress Notes (Signed)
Pt delivered viable female with APGARS 8, 9 SVD, Dr Ellyn Hack present.

## 2012-01-24 ENCOUNTER — Inpatient Hospital Stay (HOSPITAL_COMMUNITY): Payer: Medicaid Other | Admitting: Anesthesiology

## 2012-01-24 ENCOUNTER — Encounter (HOSPITAL_COMMUNITY): Payer: Self-pay | Admitting: Anesthesiology

## 2012-01-24 ENCOUNTER — Encounter (HOSPITAL_COMMUNITY)
Admission: RE | Disposition: A | Discharge status: Home / Self Care | Payer: Self-pay | Source: Ambulatory Visit | Attending: Obstetrics and Gynecology

## 2012-01-24 ENCOUNTER — Encounter (HOSPITAL_COMMUNITY): Payer: Self-pay

## 2012-01-24 DIAGNOSIS — Z9851 Tubal ligation status: Secondary | ICD-10-CM

## 2012-01-24 HISTORY — PX: TUBAL LIGATION: SHX77

## 2012-01-24 HISTORY — DX: Tubal ligation status: Z98.51

## 2012-01-24 LAB — SURGICAL PCR SCREEN
MRSA, PCR: NEGATIVE
Staphylococcus aureus: NEGATIVE

## 2012-01-24 LAB — CBC
Hemoglobin: 9.2 g/dL — ABNORMAL LOW (ref 12.0–15.0)
RBC: 2.66 MIL/uL — ABNORMAL LOW (ref 3.87–5.11)

## 2012-01-24 SURGERY — LIGATION, FALLOPIAN TUBE, POSTPARTUM
Anesthesia: Epidural | Site: Abdomen | Laterality: Bilateral | Wound class: Clean Contaminated

## 2012-01-24 SURGERY — LIGATION, FALLOPIAN TUBE, POSTPARTUM
Anesthesia: Epidural | Laterality: Bilateral

## 2012-01-24 MED ORDER — ONDANSETRON HCL 4 MG PO TABS: 4.0000 mg | ORAL_TABLET | ORAL | Status: DC | PRN

## 2012-01-24 MED ORDER — LACTATED RINGERS IV SOLN
INTRAVENOUS | Status: DC | PRN
  Administered 2012-01-24 (×2): via INTRAVENOUS

## 2012-01-24 MED ORDER — LACTATED RINGERS IV SOLN: INTRAVENOUS | Status: DC

## 2012-01-24 MED ORDER — WITCH HAZEL-GLYCERIN EX PADS: 1.0000 "application " | MEDICATED_PAD | CUTANEOUS | Status: DC | PRN

## 2012-01-24 MED ORDER — TETANUS-DIPHTH-ACELL PERTUSSIS 5-2.5-18.5 LF-MCG/0.5 IM SUSP: 0.5000 mL | Freq: Once | INTRAMUSCULAR | Status: DC

## 2012-01-24 MED ORDER — FENTANYL CITRATE 0.05 MG/ML IJ SOLN
INTRAMUSCULAR | Status: AC
  Filled 2012-01-24: qty 2

## 2012-01-24 MED ORDER — LIDOCAINE-EPINEPHRINE (PF) 2 %-1:200000 IJ SOLN
INTRAMUSCULAR | Status: AC
  Filled 2012-01-24: qty 20

## 2012-01-24 MED ORDER — SIMETHICONE 80 MG PO CHEW: 80.0000 mg | CHEWABLE_TABLET | ORAL | Status: DC | PRN

## 2012-01-24 MED ORDER — FENTANYL CITRATE 0.05 MG/ML IJ SOLN: 25.0000 ug | INTRAMUSCULAR | Status: DC | PRN

## 2012-01-24 MED ORDER — PROPOFOL 10 MG/ML IV EMUL
INTRAVENOUS | Status: DC | PRN
  Administered 2012-01-24: 50 ug/kg/min via INTRAVENOUS

## 2012-01-24 MED ORDER — ONDANSETRON HCL 4 MG/2ML IJ SOLN
INTRAMUSCULAR | Status: AC
  Filled 2012-01-24: qty 2

## 2012-01-24 MED ORDER — BENZOCAINE-MENTHOL 20-0.5 % EX AERO: 1.0000 "application " | INHALATION_SPRAY | CUTANEOUS | Status: DC | PRN

## 2012-01-24 MED ORDER — LIDOCAINE-EPINEPHRINE 2 %-1:100000 IJ SOLN
INTRAMUSCULAR | Status: DC | PRN
  Administered 2012-01-24: 5 mL via INTRADERMAL
  Administered 2012-01-24: 3 mL via INTRADERMAL
  Administered 2012-01-24: 2 mL via INTRADERMAL
  Administered 2012-01-24 (×2): 5 mL via INTRADERMAL

## 2012-01-24 MED ORDER — PRENATAL MULTIVITAMIN CH: 1.0000 | ORAL_TABLET | Freq: Every day | ORAL | Status: DC

## 2012-01-24 MED ORDER — MIDAZOLAM HCL 2 MG/2ML IJ SOLN
INTRAMUSCULAR | Status: AC
  Filled 2012-01-24: qty 2

## 2012-01-24 MED ORDER — LANOLIN HYDROUS EX OINT: TOPICAL_OINTMENT | CUTANEOUS | Status: DC | PRN

## 2012-01-24 MED ORDER — DIPHENHYDRAMINE HCL 25 MG PO CAPS: 25.0000 mg | ORAL_CAPSULE | Freq: Four times a day (QID) | ORAL | Status: DC | PRN

## 2012-01-24 MED ORDER — PROPOFOL 10 MG/ML IV EMUL
INTRAVENOUS | Status: AC
  Filled 2012-01-24: qty 40

## 2012-01-24 MED ORDER — METOCLOPRAMIDE HCL 10 MG PO TABS: 10.0000 mg | ORAL_TABLET | Freq: Once | ORAL | Status: DC

## 2012-01-24 MED ORDER — KETOROLAC TROMETHAMINE 30 MG/ML IJ SOLN
30.0000 mg | Freq: Once | INTRAMUSCULAR | Status: AC
  Administered 2012-01-24: 30 mg via INTRAVENOUS
  Filled 2012-01-24: qty 1

## 2012-01-24 MED ORDER — SODIUM BICARBONATE 8.4 % IV SOLN
INTRAVENOUS | Status: AC
  Filled 2012-01-24: qty 50

## 2012-01-24 MED ORDER — ONDANSETRON HCL 4 MG/2ML IJ SOLN: 4.0000 mg | INTRAMUSCULAR | Status: DC | PRN

## 2012-01-24 MED ORDER — KETOROLAC TROMETHAMINE 30 MG/ML IJ SOLN
INTRAMUSCULAR | Status: AC
  Filled 2012-01-24: qty 1

## 2012-01-24 MED ORDER — DIBUCAINE 1 % RE OINT: 1.0000 "application " | TOPICAL_OINTMENT | RECTAL | Status: DC | PRN

## 2012-01-24 MED ORDER — KETOROLAC TROMETHAMINE 30 MG/ML IJ SOLN
INTRAMUSCULAR | Status: DC | PRN
  Administered 2012-01-24: 30 mg via INTRAVENOUS

## 2012-01-24 MED ORDER — IBUPROFEN 600 MG PO TABS: 600.0000 mg | ORAL_TABLET | Freq: Four times a day (QID) | ORAL | Status: DC

## 2012-01-24 MED ORDER — LIDOCAINE HCL (CARDIAC) 20 MG/ML IV SOLN
INTRAVENOUS | Status: AC
  Filled 2012-01-24: qty 5

## 2012-01-24 MED ORDER — MIDAZOLAM HCL 5 MG/ML IJ SOLN
INTRAMUSCULAR | Status: DC | PRN
  Administered 2012-01-24: 2 mg via INTRAVENOUS

## 2012-01-24 MED ORDER — MORPHINE SULFATE 4 MG/ML IJ SOLN
2.0000 mg | Freq: Once | INTRAMUSCULAR | Status: AC
  Administered 2012-01-24: 2 mg via INTRAVENOUS
  Filled 2012-01-24: qty 1

## 2012-01-24 MED ORDER — FAMOTIDINE 20 MG PO TABS: 40.0000 mg | ORAL_TABLET | Freq: Once | ORAL | Status: DC

## 2012-01-24 MED ORDER — ONDANSETRON HCL 4 MG/2ML IJ SOLN
INTRAMUSCULAR | Status: DC | PRN
  Administered 2012-01-24: 4 mg via INTRAVENOUS

## 2012-01-24 MED ORDER — DEXTROSE 5 % IV SOLN: 2.0000 mg/h | Freq: Once | INTRAVENOUS | Status: DC

## 2012-01-24 MED ORDER — FAMOTIDINE 20 MG PO TABS
40.0000 mg | ORAL_TABLET | Freq: Once | ORAL | Status: AC
  Administered 2012-01-24: 40 mg via ORAL
  Filled 2012-01-24: qty 2

## 2012-01-24 MED ORDER — ZOLPIDEM TARTRATE 5 MG PO TABS: 5.0000 mg | ORAL_TABLET | Freq: Every evening | ORAL | Status: DC | PRN

## 2012-01-24 MED ORDER — SENNOSIDES-DOCUSATE SODIUM 8.6-50 MG PO TABS: 2.0000 | ORAL_TABLET | Freq: Every day | ORAL | Status: DC

## 2012-01-24 MED ORDER — OXYCODONE-ACETAMINOPHEN 5-325 MG PO TABS: 1.0000 | ORAL_TABLET | ORAL | Status: DC | PRN

## 2012-01-24 MED ORDER — METOCLOPRAMIDE HCL 10 MG PO TABS
10.0000 mg | ORAL_TABLET | Freq: Once | ORAL | Status: AC
  Administered 2012-01-24: 10 mg via ORAL
  Filled 2012-01-24: qty 1

## 2012-01-24 MED ORDER — FENTANYL CITRATE 0.05 MG/ML IJ SOLN
INTRAMUSCULAR | Status: DC | PRN
  Administered 2012-01-24: 100 ug via INTRAVENOUS

## 2012-01-24 SURGICAL SUPPLY — 21 items
BLADE SURG 11 STRL SS (BLADE) ×2 IMPLANT
CHLORAPREP W/TINT 26ML (MISCELLANEOUS) ×2 IMPLANT
CLOTH BEACON ORANGE TIMEOUT ST (SAFETY) ×2 IMPLANT
CONTAINER PREFILL 10% NBF 15ML (MISCELLANEOUS) ×4 IMPLANT
DRSG COVADERM PLUS 2X2 (GAUZE/BANDAGES/DRESSINGS) ×1 IMPLANT
ELECT REM PT RETURN 9FT ADLT (ELECTROSURGICAL) ×2
ELECTRODE REM PT RTRN 9FT ADLT (ELECTROSURGICAL) ×1 IMPLANT
GLOVE BIO SURGEON STRL SZ 6.5 (GLOVE) ×5 IMPLANT
GOWN PREVENTION PLUS LG XLONG (DISPOSABLE) ×4 IMPLANT
GOWN SURGICAL XLG (GOWNS) ×1 IMPLANT
NS IRRIG 1000ML POUR BTL (IV SOLUTION) ×2 IMPLANT
PACK ABDOMINAL MINOR (CUSTOM PROCEDURE TRAY) ×2 IMPLANT
PENCIL BUTTON HOLSTER BLD 10FT (ELECTRODE) IMPLANT
SPONGE LAP 4X18 X RAY DECT (DISPOSABLE) IMPLANT
SUT PLAIN 0 NONE (SUTURE) ×2 IMPLANT
SUT VIC AB 0 CT1 27 (SUTURE) ×2
SUT VIC AB 0 CT1 27XBRD ANBCTR (SUTURE) ×1 IMPLANT
SUT VIC AB 3-0 FS2 27 (SUTURE) ×2 IMPLANT
TOWEL OR 17X24 6PK STRL BLUE (TOWEL DISPOSABLE) ×4 IMPLANT
TRAY FOLEY CATH 14FR (SET/KITS/TRAYS/PACK) ×2 IMPLANT
WATER STERILE IRR 1000ML POUR (IV SOLUTION) ×2 IMPLANT

## 2012-01-24 NOTE — Transfer of Care (Signed)
Immediate Anesthesia Transfer of Care Note  Patient: Stacey Sparks  Procedure(s) Performed: Procedure(s) (LRB): POST PARTUM TUBAL LIGATION (Bilateral)  Patient Location: PACU  Anesthesia Type: Epidural  Level of Consciousness: awake, alert  and oriented  Airway & Oxygen Therapy: Patient Spontanous Breathing  Post-op Assessment: Report given to PACU RN and Post -op Vital signs reviewed and stable  Post vital signs: stable  Complications: No apparent anesthesia complications

## 2012-01-24 NOTE — Anesthesia Postprocedure Evaluation (Signed)
  Anesthesia Post-op Note  Patient: Stacey Sparks  Procedure(s) Performed: Procedure(s) (LRB): POST PARTUM TUBAL LIGATION (Bilateral)   Patient is awake, responsive, moving her legs, and has signs of resolution of her numbness. Pain and nausea are reasonably well controlled. Vital signs are stable and clinically acceptable. Oxygen saturation is clinically acceptable. There are no apparent anesthetic complications at this time. Patient is ready for discharge.

## 2012-01-24 NOTE — Progress Notes (Signed)
Post Partum Day 1 Subjective: no complaints, up ad lib, voiding and tolerating POnl lochia, pain controlled.  For tubal today.  Objective: Blood pressure 121/69, pulse 70, temperature 98 F (36.7 C), temperature source Oral, resp. rate 18, height 5\' 7"  (1.702 m), weight 97.07 kg (214 lb), last menstrual period 04/12/2011, SpO2 100.00%, unknown if currently breastfeeding.  Physical Exam:  General: alert and no distress Lochia: appropriate Uterine Fundus: firm   Basename 01/24/12 0500 01/23/12 0744  HGB 9.2* 10.4*  HCT 27.3* 31.1*    Assessment/Plan: Plan for discharge tomorrow and Contraception PP BTL, scheduled for today   LOS: 1 day   BOVARD,Keela Rubert 01/24/2012, 9:16 AM

## 2012-01-24 NOTE — Anesthesia Postprocedure Evaluation (Signed)
  Anesthesia Post-op Note  Patient: Stacey Sparks  Procedure(s) Performed: Procedure(s) (LRB): POST PARTUM TUBAL LIGATION (Bilateral)  Patient Location: PACU and Mother/Baby  Anesthesia Type: Epidural  Level of Consciousness: awake, alert  and oriented  Airway and Oxygen Therapy: Patient Spontanous Breathing   Post-op Assessment: Post-op Vital signs reviewed and Respiratory Function Stable  Post-op Vital Signs: stable  Complications: No apparent anesthesia complications

## 2012-01-25 MED ORDER — OXYCODONE-ACETAMINOPHEN 5-325 MG PO TABS: 1.0000 | ORAL_TABLET | Freq: Four times a day (QID) | ORAL | Status: AC | PRN

## 2012-01-25 MED ORDER — IBUPROFEN 800 MG PO TABS: 800.0000 mg | ORAL_TABLET | Freq: Three times a day (TID) | ORAL | Status: AC | PRN

## 2012-01-25 MED ORDER — PRENATAL MULTIVITAMIN CH: 1.0000 | ORAL_TABLET | Freq: Every day | ORAL | Status: DC

## 2012-01-25 NOTE — Discharge Summary (Signed)
Obstetric Discharge Summary Reason for Admission: induction of labor Prenatal Procedures: none Intrapartum Procedures: spontaneous vaginal delivery Postpartum Procedures: P.P. tubal ligation Complications-Operative and Postpartum: B Labial laceration Hemoglobin  Date Value Range Status  01/24/2012 9.2* 12.0-15.0 (g/dL) Final     HCT  Date Value Range Status  01/24/2012 27.3* 36.0-46.0 (%) Final    Discharge Diagnoses: Term Pregnancy-delivered  Discharge Information: Date: 01/25/2012 Activity: pelvic rest Diet: routine Medications: PNV, Ibuprofen and Percocet Condition: stable Instructions: refer to practice specific booklet Discharge to: home Follow-up Information    Follow up with BOVARD,Jalei Shibley, MD. Schedule an appointment as soon as possible for a visit in 6 weeks.   Contact information:   [address] [place] [address] [phone]          Newborn Data: Live born female  Birth Weight: 8 lb 2.3 oz (3694 g) APGAR: 8, 9  Home with mother.  BOVARD,Grayer Sproles 01/25/2012, 11:37 AM

## 2012-01-25 NOTE — Progress Notes (Signed)
Post Partum Day 2/ Post op Day 1 Subjective: no complaints, up ad lib, voiding, tolerating PO and nl lochia, pain controlled.  Incision site sore  Objective: Blood pressure 116/72, pulse 68, temperature 98.3 F (36.8 C), temperature source Oral, resp. rate 16, height 5\' 7"  (1.702 m), weight 97.07 kg (214 lb), last menstrual period 04/12/2011, SpO2 100.00%, unknown if currently breastfeeding.  Physical Exam:  General: alert and no distress Lochia: appropriate Uterine Fundus: firm Incision: healing well DVT Evaluation: No evidence of DVT seen on physical exam.   Basename 01/24/12 0500 01/23/12 0744  HGB 9.2* 10.4*  HCT 27.3* 31.1*    Assessment/Plan: Discharge home.  D/c with Motrin/Percocet/ PNV.  F/u 6 weeks, BTL 3/9   LOS: 2 days   BOVARD,Kimo Bancroft 01/25/2012, 11:31 AM

## 2012-01-26 ENCOUNTER — Encounter (HOSPITAL_COMMUNITY): Payer: Self-pay | Admitting: Obstetrics and Gynecology

## 2012-01-27 NOTE — Progress Notes (Signed)
Post discharge chart review completed.  

## 2012-01-30 ENCOUNTER — Encounter (HOSPITAL_COMMUNITY): Payer: Self-pay | Admitting: *Deleted

## 2012-01-30 ENCOUNTER — Inpatient Hospital Stay (HOSPITAL_COMMUNITY)
Admission: AD | Admit: 2012-01-30 | Discharge: 2012-01-30 | Disposition: A | Discharge status: Home / Self Care | Payer: Medicaid Other | Source: Ambulatory Visit | Attending: Obstetrics and Gynecology | Admitting: Obstetrics and Gynecology

## 2012-01-30 DIAGNOSIS — O165 Unspecified maternal hypertension, complicating the puerperium: Secondary | ICD-10-CM

## 2012-01-30 DIAGNOSIS — O135 Gestational [pregnancy-induced] hypertension without significant proteinuria, complicating the puerperium: Secondary | ICD-10-CM | POA: Insufficient documentation

## 2012-01-30 DIAGNOSIS — R7989 Other specified abnormal findings of blood chemistry: Secondary | ICD-10-CM

## 2012-01-30 DIAGNOSIS — R945 Abnormal results of liver function studies: Secondary | ICD-10-CM

## 2012-01-30 DIAGNOSIS — O99893 Other specified diseases and conditions complicating puerperium: Secondary | ICD-10-CM | POA: Insufficient documentation

## 2012-01-30 HISTORY — DX: Unspecified pre-eclampsia, unspecified trimester: O14.90

## 2012-01-30 LAB — URINALYSIS, ROUTINE W REFLEX MICROSCOPIC
Bilirubin Urine: NEGATIVE
Leukocytes, UA: NEGATIVE
Nitrite: NEGATIVE
Specific Gravity, Urine: <1.005 — ABNORMAL LOW (ref 1.005–1.030)
pH: 6 (ref 5.0–8.0)

## 2012-01-30 LAB — COMPREHENSIVE METABOLIC PANEL
ALT: 61 U/L — ABNORMAL HIGH (ref 0–35)
Calcium: 9.6 mg/dL (ref 8.4–10.5)
GFR calc Af Amer: 84 mL/min — ABNORMAL LOW (ref >90)
Glucose, Bld: 83 mg/dL (ref 70–99)
Sodium: 139 mEq/L (ref 135–145)
Total Protein: 5.8 g/dL — ABNORMAL LOW (ref 6.0–8.3)

## 2012-01-30 LAB — CBC
Hemoglobin: 9.8 g/dL — ABNORMAL LOW (ref 12.0–15.0)
MCH: 34.6 pg — ABNORMAL HIGH (ref 26.0–34.0)
MCHC: 33.2 g/dL (ref 30.0–36.0)
Platelets: 287 10*3/uL (ref 150–400)

## 2012-01-30 LAB — URIC ACID: Uric Acid, Serum: 5.9 mg/dL (ref 2.4–7.0)

## 2012-01-30 LAB — URINE MICROSCOPIC-ADD ON

## 2012-01-30 LAB — LACTATE DEHYDROGENASE: LDH: 268 U/L — ABNORMAL HIGH (ref 94–250)

## 2012-01-30 MED ORDER — LABETALOL HCL 100 MG PO TABS: 100.0000 mg | ORAL_TABLET | Freq: Two times a day (BID) | ORAL | Status: AC

## 2012-01-30 MED ORDER — LABETALOL HCL 100 MG PO TABS
100.0000 mg | ORAL_TABLET | Freq: Once | ORAL | Status: AC
  Administered 2012-01-30: 100 mg via ORAL
  Filled 2012-01-30: qty 1

## 2012-01-30 NOTE — MAU Provider Note (Signed)
History     CSN: [id number]  Arrival date and time: 01/30/12 1736   First Provider Initiated Contact with Patient 01/30/12 1804      No chief complaint on file.  HPI This is a 31 year old G6 P4 0-4 who is approximately 7 days postpartum from a vaginal delivery. She is followed by Dr. Ellyn Hack.  Since her arrival home she has had 3+ pedal edema to her mid tibia. This has been worsening over the past 3 or 4 days. She also has had a headache and scotoma was past through to 4 days. She presents the MAU with complaints. These complaints had no palliating factors.  OB History    Grav Para Term Preterm Abortions TAB SAB Ect Mult Living   6 4 4  0 2 1 1  0 0 4      Past Medical History  Diagnosis Date  . Abnormal Pap smear   . Headache     migraine  . Ovarian cyst   . S/P tubal ligation 01/24/2012  . Preeclampsia postpartum    Past Surgical History  Procedure Date  . Femur surgery 1996  . Tubal ligation 01/24/2012    Procedure: POST PARTUM TUBAL LIGATION;  Surgeon: Sherron Monday, MD;  Location: [facility];  Service: Gynecology;  Laterality: Bilateral;    Family History  Problem Relation Age of Onset  . Arthritis Mother   . Diabetes Mother   . Migraines Brother   . Diabetes Maternal Aunt   . Mental retardation Maternal Aunt     schizophrenia  . Diabetes Maternal Uncle     History  Substance Use Topics  . Smoking status: Never Smoker   . Smokeless tobacco: Never Used  . Alcohol Use: No    Allergies: No Known Allergies  Prescriptions prior to admission  Medication Sig Dispense Refill  . calcium carbonate (TUMS - DOSED IN MG ELEMENTAL CALCIUM) 500 MG chewable tablet Chew 1 tablet by mouth 2 (two) times daily as needed. For heartburn      . ibuprofen (ADVIL,MOTRIN) 800 MG tablet Take 1 tablet (800 mg total) by mouth every 8 (eight) hours as needed for pain.  40 tablet  1  . oxyCODONE-acetaminophen (PERCOCET) 5-325 MG per tablet Take 1-2 tablets by mouth every 6 (six) hours as  needed (moderate - severe pain).  20 tablet  0  . Prenatal Vit-Fe Fumarate-FA (PRENATAL MULTIVITAMIN) TABS Take 1 tablet by mouth daily.  30 tablet  12    Review of Systems  All other systems reviewed and are negative.   Physical Exam   Blood pressure 161/93, pulse 61, temperature 97.7 F (36.5 C), temperature source Oral, resp. rate 18, height 5\' 7"  (1.702 m), weight 97.07 kg (214 lb), last menstrual period 04/12/2011, not currently breastfeeding.  Physical Exam  Constitutional: She is oriented to person, place, and time. She appears well-developed and well-nourished.  Cardiovascular: Normal rate.   Respiratory: Effort normal.  GI: Soft. Bowel sounds are normal. She exhibits no distension and no mass. There is no tenderness. There is no rebound and no guarding.  Musculoskeletal: She exhibits edema (3+ pitting edema to mid tibia). She exhibits no tenderness.  Neurological: She is alert and oriented to person, place, and time.  Skin: Skin is warm and dry. No rash noted. No erythema. No pallor.  Psychiatric: She has a normal mood and affect. Her behavior is normal. Judgment and thought content normal.   Results for orders placed during the hospital encounter of 01/30/12 (from the past  24 hour(s))  CBC     Status: Abnormal   Collection Time   01/30/12  6:06 PM      Component Value Range   WBC 8.3  4.0 - 10.5 (K/uL)   RBC 2.83 (*) 3.87 - 5.11 (MIL/uL)   Hemoglobin 9.8 (*) 12.0 - 15.0 (g/dL)   HCT 16.1 (*) 09.6 - 46.0 (%)   MCV 104.2 (*) 78.0 - 100.0 (fL)   MCH 34.6 (*) 26.0 - 34.0 (pg)   MCHC 33.2  30.0 - 36.0 (g/dL)   RDW 04.5  40.9 - 81.1 (%)   Platelets 287  150 - 400 (K/uL)  COMPREHENSIVE METABOLIC PANEL     Status: Abnormal   Collection Time   01/30/12  6:06 PM      Component Value Range   Sodium 139  135 - 145 (mEq/L)   Potassium 4.0  3.5 - 5.1 (mEq/L)   Chloride 104  96 - 112 (mEq/L)   CO2 25  19 - 32 (mEq/L)   Glucose, Bld 83  70 - 99 (mg/dL)   BUN 11  6 - 23 (mg/dL)     Creatinine, Ser 9.14  0.50 - 1.10 (mg/dL)   Calcium 9.6  8.4 - 78.2 (mg/dL)   Total Protein 5.8 (*) 6.0 - 8.3 (g/dL)   Albumin 2.7 (*) 3.5 - 5.2 (g/dL)   AST 50 (*) 0 - 37 (U/L)   ALT 61 (*) 0 - 35 (U/L)   Alkaline Phosphatase 120 (*) 39 - 117 (U/L)   Total Bilirubin 0.2 (*) 0.3 - 1.2 (mg/dL)   GFR calc non Af Amer 73 (*) >90 (mL/min)   GFR calc Af Amer 84 (*) >90 (mL/min)  URIC ACID     Status: Normal   Collection Time   01/30/12  6:06 PM      Component Value Range   Uric Acid, Serum 5.9  2.4 - 7.0 (mg/dL)  LACTATE DEHYDROGENASE     Status: Abnormal   Collection Time   01/30/12  6:06 PM      Component Value Range   LD 268 (*) 94 - 250 (U/L)  URINALYSIS, ROUTINE W REFLEX MICROSCOPIC     Status: Abnormal   Collection Time   01/30/12  6:30 PM      Component Value Range   Color, Urine YELLOW  YELLOW    APPearance CLEAR  CLEAR    Specific Gravity, Urine <1.005 (*) 1.005 - 1.030    pH 6.0  5.0 - 8.0    Glucose, UA NEGATIVE  NEGATIVE (mg/dL)   Hgb urine dipstick MODERATE (*) NEGATIVE    Bilirubin Urine NEGATIVE  NEGATIVE    Ketones, ur NEGATIVE  NEGATIVE (mg/dL)   Protein, ur NEGATIVE  NEGATIVE (mg/dL)   Urobilinogen, UA 0.2  0.0 - 1.0 (mg/dL)   Nitrite NEGATIVE  NEGATIVE    Leukocytes, UA NEGATIVE  NEGATIVE   URINE MICROSCOPIC-ADD ON     Status: Normal   Collection Time   01/30/12  6:30 PM      Component Value Range   Squamous Epithelial / LPF RARE  RARE    WBC, UA 0-2  <3 (WBC/hpf)   RBC / HPF 0-2  <3 (RBC/hpf)   Bacteria, UA RARE  RARE     MAU Course  Procedures  MDM I discussed this patient thoroughly with Dr. Ambrose Mantle, who felt that the patient was not having preeclampsia. I will start labetalol 100 mg twice a day for blood pressure and instructed her  to followup in the office on Monday to recheck her labs and blood pressure. I discussed signs and symptoms of preeclampsia and advised the patient to return if she develops headache or scotoma.  Assessment and Plan  #1  hypertension #2 elevated LFTs  We'll send patient home with labetalol as above with structures to followup with primary obstetrician on Monday.  Candelaria Celeste JEHIEL 01/30/2012, 6:48 PM

## 2012-01-30 NOTE — Discharge Instructions (Signed)
Hypertension During Pregnancy Hypertension is also called high blood pressure. Blood pressure moves blood in your body. Sometimes, the force that moves the blood becomes too strong. When you are pregnant, this condition should be watched carefully. It can cause problems for you and your baby. HOME CARE   Make and keep all of your doctor visits.   Take medicine as told by your doctor. Tell your doctor about all medicines you take.   Eat very little salt.   Exercise regularly.   Do not drink alcohol.   Do not smoke.   Do not have drinks with caffeine.   Lie on your left side when resting.  GET HELP RIGHT AWAY IF:  You have bad belly (abdominal) pain.   You have sudden puffiness (swelling) in the hands, ankles, or face.   You gain 4 pounds (1.8 kilograms) or more in 1 week.   You throw up (vomit) repeatedly.   You have bleeding from the vagina.   You do not feel the baby moving as much.   You have a headache.   You have blurred or double vision.   You have muscle twitching or spasms.   You have shortness of breath.   You have blue fingernails and lips.   You have blood in your pee (urine).  MAKE SURE YOU:  Understand these instructions.   Will watch your condition.   Will get help right away if you are not doing well.  Document Released: 12/06/2010 Document Revised: 10/23/2011 Document Reviewed: 06/20/2011 [employer] Patient Information 2012 [employer]. 

## 2012-01-30 NOTE — MAU Note (Addendum)
Pt in c/o progressively worsening headaches since coming on home on Sunday, delivered vaginally on 01/23/12.  Had home health nurse come on Wednesday, slightly elevated blood pressures.  Reports floaters x3 days.  Reports increased swelling in legs and feet. Reports sob when feet are elevated.

## 2012-02-23 NOTE — Op Note (Signed)
Stacey Sparks, Stacey Sparks NO.:  [id number]  MEDICAL RECORD NO.:  [id number]  LOCATION:  9123                          FACILITY:  [facility]  PHYSICIAN:  Sherron Monday, MD        DATE OF BIRTH:  11-May-1981  DATE OF PROCEDURE:  01/24/2012 DATE OF DISCHARGE:  01/25/2012                              OPERATIVE REPORT   PREOPERATIVE DIAGNOSIS:  Undesired fertility.  POSTOPERATIVE DIAGNOSIS:  Undesired fertility.  PROCEDURE:  Postpartum tubal ligation by Pomeroy method.  SURGEON:  Sherron Monday, MD  ANESTHESIA:  Epidural.  FINDINGS:  Normal postpartum anatomy.  PROCEDURE IN DETAIL:  After informed consent was reviewed with the patient including risks, benefits, and alternatives of the surgical procedure, she was transferred to the operating room in stable condition, placed on the table in supine position, after epidural had been redosed.  She was prepped and draped in the normal sterile fashion. Her bladder was sterilely drained.  An approximately 10 mm infraumbilical incision was made, carried through the underlying layer of fascia.  The fascia was grasped with Kocher clamps, elevated, and incised in midline.  Incision was extended and attention was turned to identifying the fallopian tubes.  Tubes there followed out to the fimbriated end, confirmed by scrub tech and myself and an approximately 2 cm portion was removed in a Pomeroy fashion.  It was doubly ligated with 0 plain gut.  The pedicle was found to be hemostatic.  The patient tolerated the procedure well. Similar procedure was begun, performed on both the left and right.  The fascia was reapproximated with 0 Vicryl.  Plain gut was used to close the dead space.  Skin was closed with Dermabond and subcuticular stitch of 3-0 Vicryl.  The patient tolerated the procedure well.  Sponge, lap, and needle count was correct x2 at the end of the procedure.     Sherron Monday, MD     JB/MEDQ  D:  02/23/2012  T:  02/23/2012   Job:  [id number]

## 2012-09-06 IMAGING — US US OB COMP LESS 14 WK
1 series · 13 of 28 positions shown · non-contrast
Comparison: none

[Series 1: us ob comp less 14 wks · 39 acquisitions, 13 frames shown]
[im 2/39]
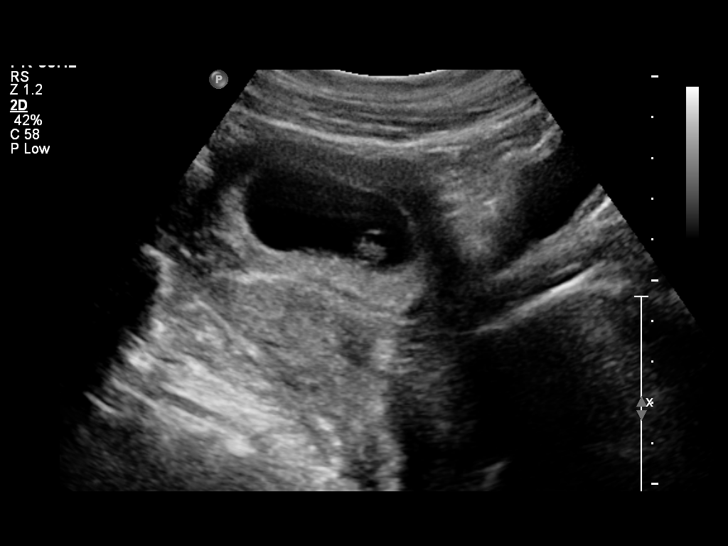
[im 5/39]
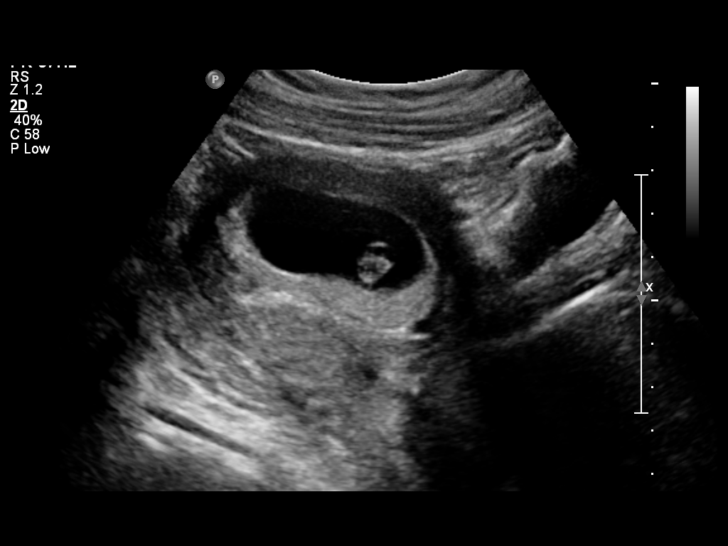
[im 8/39]
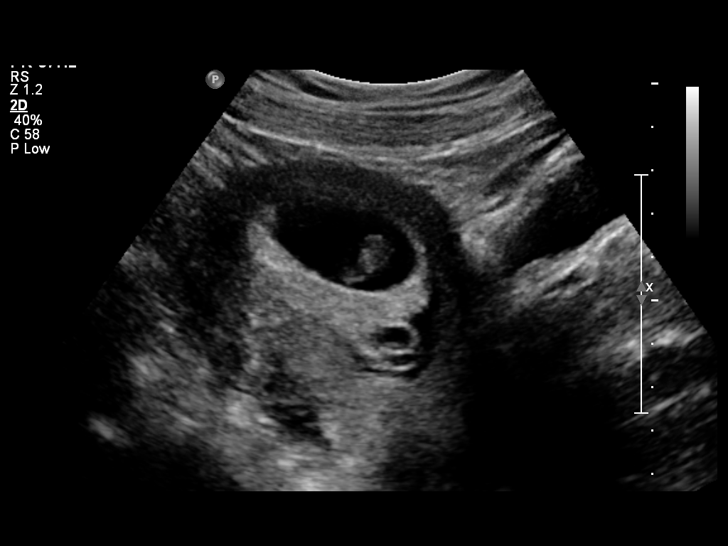
[im 10/39]
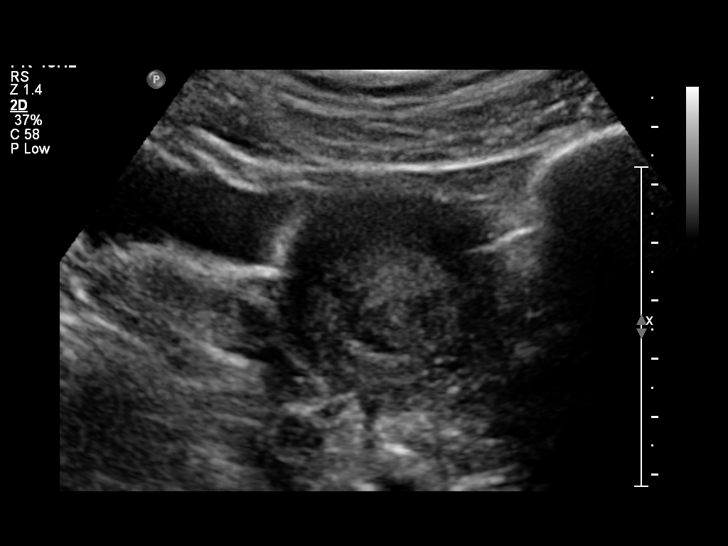
[im 13/39]
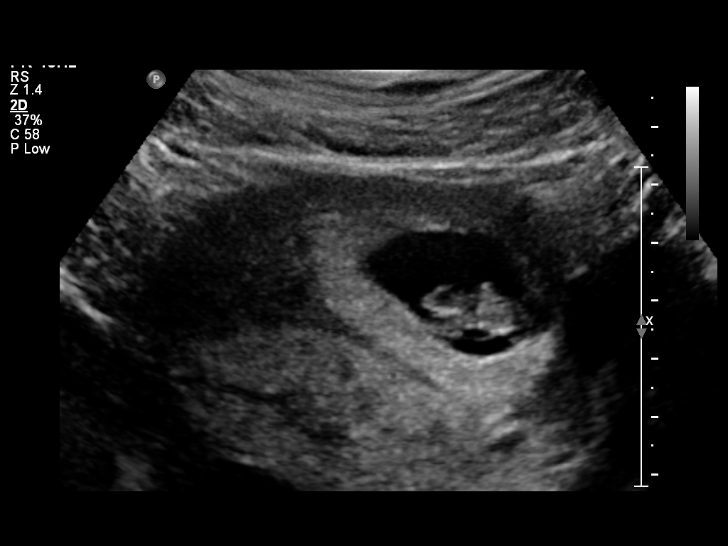
[im 16/39]
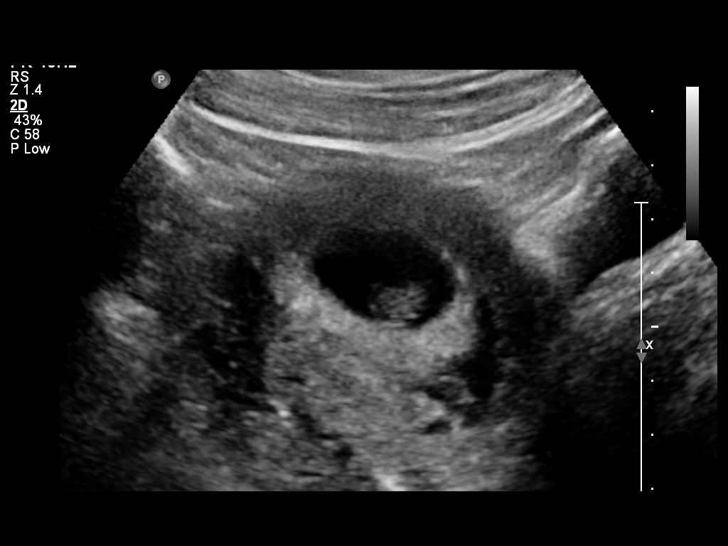
[im 20/39]
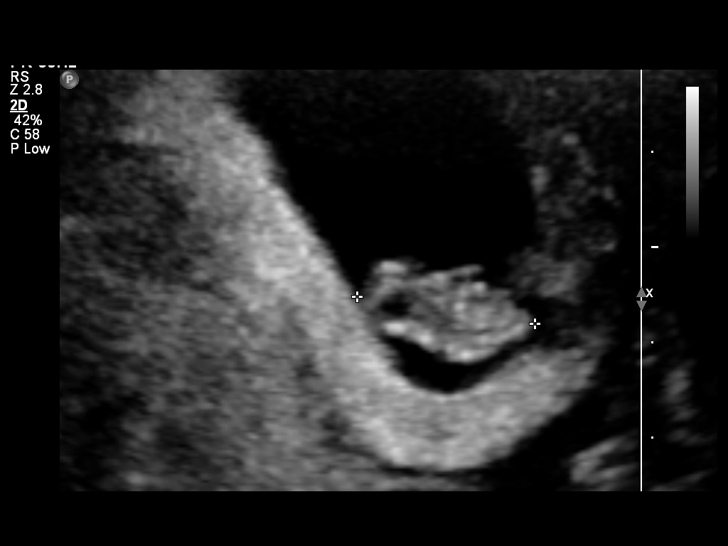
[im 23/39]
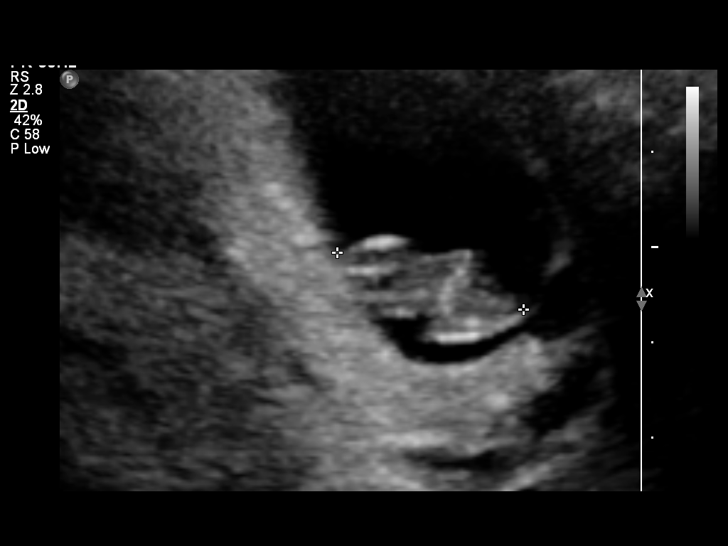
[im 26/39]
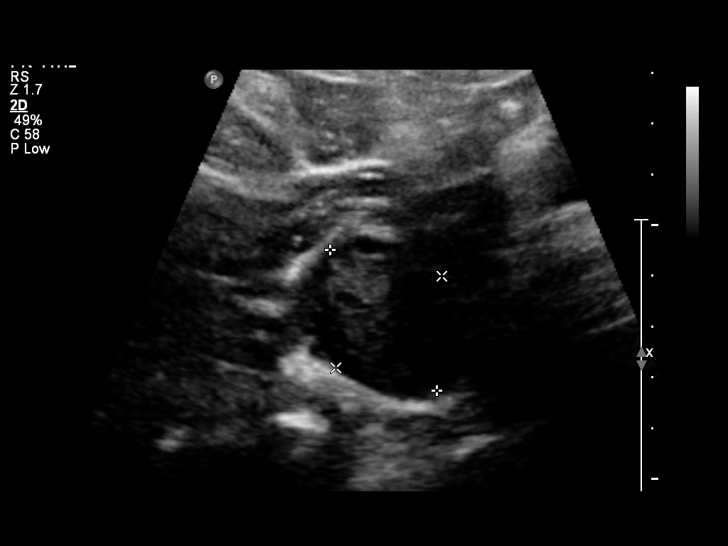
[im 29/39]
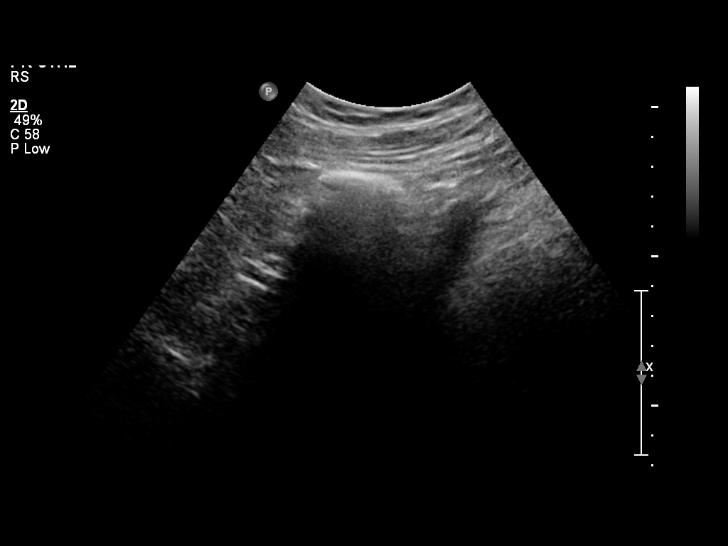
[im 31/39]
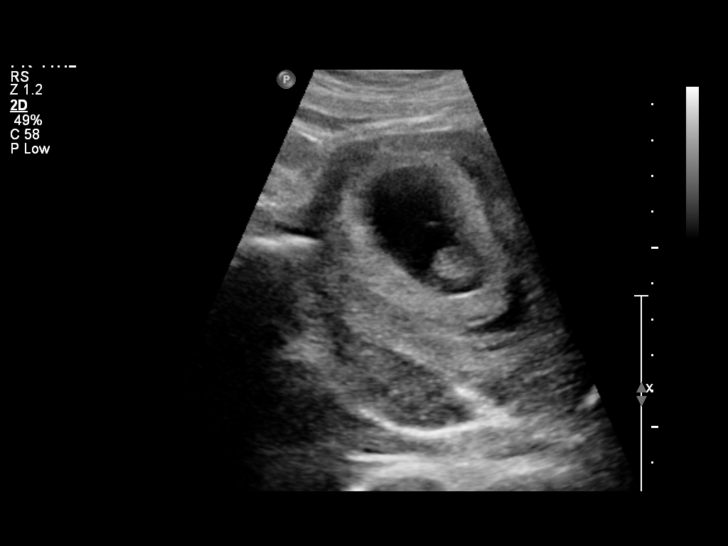
[im 34/39]
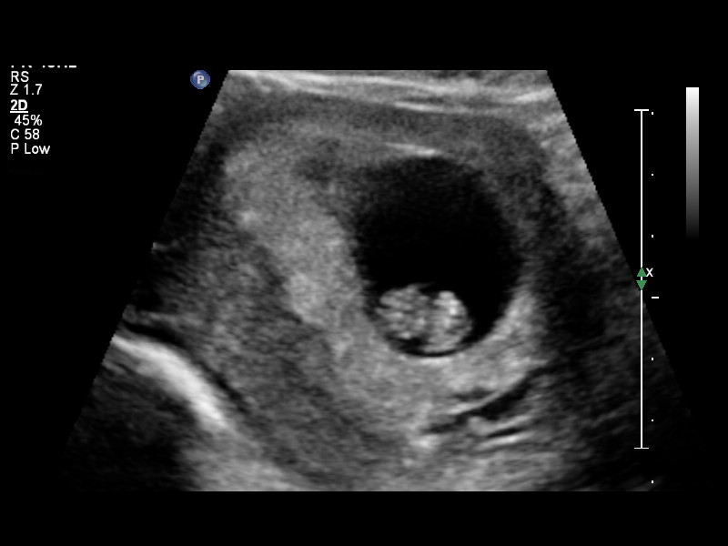
[im 37/39]
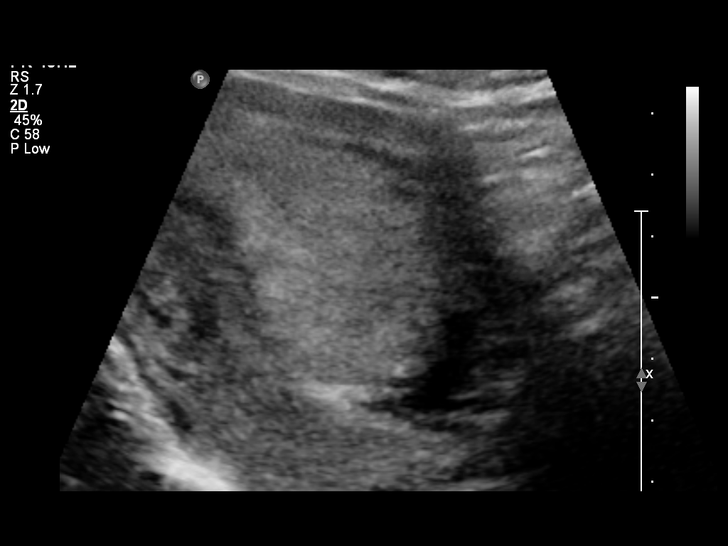

[13 of 28 positions shown; findings below may reference images not displayed]

OBSTETRICS REPORT
                      (Signed Final 06/20/2011 [DATE])

 Order#:         [id number]
Procedures

 US OB COMP LESS 14 WKS                                76801.0
Indications

 Assess Viability
Fetal Evaluation

 Preg. Location:    Intrauterine
 Gest. Sac:         Intrauterine
 Yolk Sac:          Visualized
 Fetal Pole:        Visualized
 Fetal Heart Rate:  170                          bpm
 Cardiac Activity:  Observed

 Comment:    Small subchorionic hemorrhage noted = 2.5 cm.
Biometry

 CRL:     19.4  mm     G. Age:  8w 2d                  EDD:    01/28/12
Gestational Age

 LMP:           9w 6d         Date:  04/12/11                 EDD:   01/17/12
 Best:          8w 2d      Det. By:  U/S C R L (06/20/11)     EDD:   01/28/12
Cervix Uterus Adnexa

 Cervix:       Closed.
 Uterus:       No abnormality visualized.
 Cul De Sac:   No free fluid seen.
 Left Ovary:    Within normal limits.
 Right Ovary:   Within normal limits.
 Adnexa:     No abnormality visualized.
Comments

 There is a resorbing, empty second GS to the left of normal
 pregnancy that measures 1cm.
Impression

 Single living intrauterine gestation with CRL indicating an
 EGA of  8w2d.  This is 1w2d younger than estimated by LMP.
 Limited anatomic survey due to early gestational age.  Full
 anatomy scan at 18-21 weeks recommended.

## 2013-10-18 ENCOUNTER — Encounter (HOSPITAL_COMMUNITY): Payer: Self-pay | Admitting: Emergency Medicine

## 2013-10-18 ENCOUNTER — Emergency Department (HOSPITAL_COMMUNITY)
Admission: EM | Admit: 2013-10-18 | Discharge: 2013-10-18 | Discharge status: Against Medical Advice | Payer: Medicaid Other | Attending: Emergency Medicine | Admitting: Emergency Medicine

## 2013-10-18 DIAGNOSIS — Z8742 Personal history of other diseases of the female genital tract: Secondary | ICD-10-CM | POA: Insufficient documentation

## 2013-10-18 DIAGNOSIS — R5381 Other malaise: Secondary | ICD-10-CM | POA: Insufficient documentation

## 2013-10-18 DIAGNOSIS — Z9889 Other specified postprocedural states: Secondary | ICD-10-CM | POA: Insufficient documentation

## 2013-10-18 DIAGNOSIS — M79609 Pain in unspecified limb: Secondary | ICD-10-CM | POA: Insufficient documentation

## 2013-10-18 DIAGNOSIS — M79604 Pain in right leg: Secondary | ICD-10-CM

## 2013-10-18 DIAGNOSIS — Z9851 Tubal ligation status: Secondary | ICD-10-CM | POA: Insufficient documentation

## 2013-10-18 DIAGNOSIS — R209 Unspecified disturbances of skin sensation: Secondary | ICD-10-CM | POA: Insufficient documentation

## 2013-10-18 DIAGNOSIS — Z87828 Personal history of other (healed) physical injury and trauma: Secondary | ICD-10-CM | POA: Insufficient documentation

## 2013-10-18 DIAGNOSIS — R42 Dizziness and giddiness: Secondary | ICD-10-CM | POA: Insufficient documentation

## 2013-10-18 LAB — CBC WITH DIFFERENTIAL/PLATELET
Basophils Absolute: 0 10*3/uL (ref 0.0–0.1)
Eosinophils Absolute: 0 10*3/uL (ref 0.0–0.7)
Eosinophils Relative: 0 % (ref 0–5)
MCH: 35.2 pg — ABNORMAL HIGH (ref 26.0–34.0)
MCV: 103.2 fL — ABNORMAL HIGH (ref 78.0–100.0)
Monocytes Absolute: 0.5 10*3/uL (ref 0.1–1.0)
Platelets: 242 10*3/uL (ref 150–400)
RDW: 12.9 % (ref 11.5–15.5)

## 2013-10-18 LAB — BASIC METABOLIC PANEL
BUN: 6 mg/dL (ref 6–23)
CO2: 24 mEq/L (ref 19–32)
Calcium: 9 mg/dL (ref 8.4–10.5)
Creatinine, Ser: 0.69 mg/dL (ref 0.50–1.10)
Glucose, Bld: 86 mg/dL (ref 70–99)

## 2013-10-18 MED ORDER — IBUPROFEN 800 MG PO TABS
800.0000 mg | ORAL_TABLET | Freq: Once | ORAL | Status: AC
  Administered 2013-10-18: 800 mg via ORAL
  Filled 2013-10-18: qty 1

## 2013-10-18 NOTE — ED Notes (Signed)
Stacey Sparks, EMT saw pt getting dressed asked pt why she was back in street clothes, pt reports she is more comfortable in them. Then asked pt to provide urine sample, gave cup and showed her to the bathroom.
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
## 2013-10-18 NOTE — ED Notes (Signed)
Pt not in restroom. MD and PA made aware that pt may have left.

## 2013-10-18 NOTE — ED Notes (Signed)
Pt still no where to be found, pt's son is also gone. Staff believes pt has left.
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
## 2013-10-18 NOTE — ED Notes (Signed)
Pt ambulated to restroom to provide urine sample

## 2013-10-19 NOTE — ED Provider Notes (Signed)
Medical screening examination/treatment/procedure(s) were performed by non-physician practitioner and as supervising physician I was immediately available for consultation/collaboration.     Geoffery Lyons, MD 10/19/13 [phone]

## 2014-09-18 ENCOUNTER — Encounter (HOSPITAL_COMMUNITY): Payer: Self-pay | Admitting: Emergency Medicine

## 2016-12-30 ENCOUNTER — Emergency Department (HOSPITAL_COMMUNITY)
Admission: EM | Admit: 2016-12-30 | Discharge: 2016-12-30 | Disposition: A | Discharge status: Home / Self Care | Payer: Self-pay | Attending: Emergency Medicine | Admitting: Emergency Medicine

## 2016-12-30 ENCOUNTER — Encounter (HOSPITAL_COMMUNITY): Payer: Self-pay

## 2016-12-30 ENCOUNTER — Emergency Department (HOSPITAL_COMMUNITY): Payer: Self-pay

## 2016-12-30 DIAGNOSIS — M795 Residual foreign body in soft tissue: Secondary | ICD-10-CM | POA: Insufficient documentation

## 2016-12-30 DIAGNOSIS — W3400XA Accidental discharge from unspecified firearms or gun, initial encounter: Secondary | ICD-10-CM

## 2016-12-30 DIAGNOSIS — Z79899 Other long term (current) drug therapy: Secondary | ICD-10-CM | POA: Insufficient documentation
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
## 2016-12-30 NOTE — ED Notes (Signed)
Pt home stable with family with steady gait.Pt states she understands instructions.
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
## 2016-12-30 NOTE — Discharge Instructions (Signed)
Treatment: Use ice 3-4 times daily to help with pain and swelling. You can take Tylenol or ibuprofen as prescribed over-the-counter for your pain.  Follow-up: Please follow-up with the general surgeon for further evaluation and treatment of your foreign body. Please return to emergency department if you develop any new or worsening symptoms.

## 2018-06-07 ENCOUNTER — Encounter (HOSPITAL_COMMUNITY): Payer: Self-pay | Admitting: Emergency Medicine

## 2018-06-07 ENCOUNTER — Other Ambulatory Visit: Payer: Self-pay

## 2018-06-07 ENCOUNTER — Emergency Department (HOSPITAL_COMMUNITY)
Admission: EM | Admit: 2018-06-07 | Discharge: 2018-06-08 | Disposition: A | Discharge status: Other Acute Inpatient Hospital | Payer: Self-pay | Attending: Emergency Medicine | Admitting: Emergency Medicine

## 2018-06-07 DIAGNOSIS — R45851 Suicidal ideations: Secondary | ICD-10-CM | POA: Insufficient documentation

## 2018-06-07 DIAGNOSIS — T50902A Poisoning by unspecified drugs, medicaments and biological substances, intentional self-harm, initial encounter: Secondary | ICD-10-CM

## 2018-06-07 DIAGNOSIS — F322 Major depressive disorder, single episode, severe without psychotic features: Secondary | ICD-10-CM | POA: Insufficient documentation

## 2018-06-07 DIAGNOSIS — T450X2A Poisoning by antiallergic and antiemetic drugs, intentional self-harm, initial encounter: Secondary | ICD-10-CM | POA: Insufficient documentation

## 2018-06-07 LAB — CBC WITH DIFFERENTIAL/PLATELET
ABS IMMATURE GRANULOCYTES: 0 10*3/uL (ref 0.0–0.1)
Basophils Absolute: 0.1 10*3/uL (ref 0.0–0.1)
Basophils Relative: 1 %
EOS PCT: 3 %
Eosinophils Absolute: 0.2 10*3/uL (ref 0.0–0.7)
HEMATOCRIT: 38.9 % (ref 36.0–46.0)
HEMOGLOBIN: 12.3 g/dL (ref 12.0–15.0)
Immature Granulocytes: 0 %
LYMPHS PCT: 27 %
Lymphs Abs: 2 10*3/uL (ref 0.7–4.0)
MCH: 33.8 pg (ref 26.0–34.0)
MCHC: 31.6 g/dL (ref 30.0–36.0)
MCV: 106.9 fL — AB (ref 78.0–100.0)
MONO ABS: 0.6 10*3/uL (ref 0.1–1.0)
MONOS PCT: 8 %
NEUTROS ABS: 4.6 10*3/uL (ref 1.7–7.7)
Neutrophils Relative %: 61 %
Platelets: 294 10*3/uL (ref 150–400)
RBC: 3.64 MIL/uL — ABNORMAL LOW (ref 3.87–5.11)
RDW: 13.1 % (ref 11.5–15.5)
WBC: 7.5 10*3/uL (ref 4.0–10.5)

## 2018-06-07 LAB — COMPREHENSIVE METABOLIC PANEL
ALT: 13 U/L (ref 0–44)
ANION GAP: 11 (ref 5–15)
AST: 22 U/L (ref 15–41)
Albumin: 4.1 g/dL (ref 3.5–5.0)
Alkaline Phosphatase: 53 U/L (ref 38–126)
BUN: 9 mg/dL (ref 6–20)
CHLORIDE: 103 mmol/L (ref 98–111)
CO2: 23 mmol/L (ref 22–32)
CREATININE: 0.99 mg/dL (ref 0.44–1.00)
Calcium: 9.5 mg/dL (ref 8.9–10.3)
GFR calc non Af Amer: >60 mL/min (ref >60)
Glucose, Bld: 79 mg/dL (ref 70–99)
Potassium: 4.2 mmol/L (ref 3.5–5.1)
Sodium: 137 mmol/L (ref 135–145)
Total Bilirubin: 1 mg/dL (ref 0.3–1.2)
Total Protein: 7.4 g/dL (ref 6.5–8.1)

## 2018-06-07 LAB — RAPID URINE DRUG SCREEN, HOSP PERFORMED
Amphetamines: NOT DETECTED
BARBITURATES: NOT DETECTED
BENZODIAZEPINES: POSITIVE — AB
COCAINE: POSITIVE — AB
OPIATES: POSITIVE — AB
Tetrahydrocannabinol: POSITIVE — AB

## 2018-06-07 LAB — CBG MONITORING, ED: GLUCOSE-CAPILLARY: 94 mg/dL (ref 70–99)

## 2018-06-07 LAB — I-STAT BETA HCG BLOOD, ED (MC, WL, AP ONLY)

## 2018-06-07 LAB — SALICYLATE LEVEL

## 2018-06-07 LAB — ACETAMINOPHEN LEVEL

## 2018-06-07 LAB — ETHANOL: Alcohol, Ethyl (B): <10 mg/dL (ref <10)

## 2018-06-07 NOTE — ED Notes (Signed)
Patient given [facility] [place] sandwich [facility]

## 2018-06-07 NOTE — ED Triage Notes (Signed)
Called Poison [phone] Monitor for 6 hours min If Tylenol Level is > 10 redraw  If tylenol Level < 10 OK Redraw Tylenol at 1700 Benzos for seizures Might have Urinary retention

## 2018-06-07 NOTE — ED Notes (Signed)
Ordered reg/no sharps tray

## 2018-06-07 NOTE — BH Assessment (Addendum)
Assessment Note  Stacey Sparks is a 37 y.o. female [facility] attempting to kill herself by overdosing on medication. Pt shared that she took approximately 15 Benadryl pills and approximately 5 muscle relaxers due to getting depressed. Pt verified to clinician that her goal for taking this medication was to kill herself. Pt states that she did the same thing when she was in middle school; she states that she went to the hospital, had her stomach pumped, and they sent her home (no follow-up with behavioral health). Pt expressed she would like to go home now and that she now knows better.  Pt is now denying SI. She also denies HI, NSSIB, and AVH. Pt states she has never worked with a Paramedictherapist nor with a psychiatrist. She denies any access to weapons, and her husband verified this, and she denies any involvement with the court system.  Pt shares she smokes two joints of marijuana on a daily basis and that she last smoked today. She shares she drinks alcohol approximately 2x / week and that, when she drinks, she drinks "a lot." Pt estimates she drinks approximately a pink of liquor when she drinks. She states the last time she drank was Saturday, June 05, 2018.  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 Pt's mother stated she believes pt has been stressed/depressed because she has not been working. Pt is married and she lives with her husband and her children; she states her husband is her greatest support.  Pt denies any family members having mental health disorders or attempting to kill themselves. She states seh has a maternal aunt that has abused substances, though she is not sure of what she uses. Pt denies ever experiencing abuse.   Pt is oriented x4. Her recent and remote memory is intact. Pt was cooperative throughout her assessment, though she and her mother and husband all stated they would like pt to go  home. When clinician probed pt's husband in regards to his thoughts regarding pt's suicide attempt, he stated it hasn't happened before, so he believes this is a one-time thing, but when gently pushed further, he began to cry and required consolation from his mother-in-law and then his wife. Pt's insight, judgement, and impulse control is poor at this time.   Diagnosis: F32.2, Major depressive disorder, Single episode, Severe; F60.3,Borderline personality disorder    Past Medical History:  Past Medical History:  Diagnosis Date  . Abnormal Pap smear   . Headache(784.0)    migraine  . Ovarian cyst   . Preeclampsia postpartum  . S/P tubal ligation 01/24/2012    Past Surgical History:  Procedure Laterality Date  . FEMUR SURGERY  1996  . TUBAL LIGATION  01/24/2012   Procedure: POST PARTUM TUBAL LIGATION;  Surgeon: Sherron MondayJody Bovard, MD;  Location: [facility];  Service: Gynecology;  Laterality: Bilateral;    Family History:  Family History  Problem Relation Age of Onset  . Arthritis Mother   . Diabetes Mother   . Migraines Brother   . Diabetes Maternal Aunt   . Mental retardation Maternal Aunt        schizophrenia  . Diabetes Maternal Uncle     Social History:  reports that she has never smoked. She has never used smokeless tobacco. She reports that she does not drink alcohol or use drugs.  Additional Social History:     CIWA: CIWA-Ar BP:  109/77 Pulse Rate: 78 COWS:    Allergies: No Known Allergies  Home Medications:  (Not in a hospital admission)  OB/GYN Status:  Patient's last menstrual period was 05/17/2018.  General Assessment Data Assessment unable to be completed: Yes Reason for not completing assessment: Went to 1st assigned room, pt not there; went to 2nd assigned room, pt not there; was informed pt was still in triage. Will attempt to do assessment at a later time. Admission Status: Voluntary                    Mental Status Report Motor Activity: Freedom of  movement, Unsteady                     Advance Directives (For Healthcare) Does Patient Have a Medical Advance Directive?: No       Disposition: Elta Guadeloupe NP reviewed pt's chart and information and determined that pt meets criteria for inpatient hospitalization at this time. Pt is currently being reviewed at [facility]; pt is not yet medically cleared and pt has not yet provided a urine sample, both of which will be required to complete the review to ensure pt's safety and stability at [facility].     On Site Evaluation by:   Reviewed with Physician:    Ralph Dowdy 06/07/2018 5:11 PM

## 2018-06-07 NOTE — ED Notes (Signed)
Family took pt belongings  ?

## 2018-06-07 NOTE — ED Notes (Signed)
Security wanded pt ?

## 2018-06-07 NOTE — ED Triage Notes (Signed)
Pt. Stated, I took a bunch of medication cause of depression trying to kill myself. I took a lot of Benadryl maybe about 15 and about 5 muscle relaxant around 100pm to 130pm. I started getting depressed.

## 2018-06-07 NOTE — ED Notes (Signed)
Contacted [place] poison control

## 2018-06-07 NOTE — ED Notes (Signed)
Patient's husband showed up unaware of visiting hours; Consulting civil engineerCharge RN provided pamphlet to husband; patient agreed to allow husband to see her for 30 minute visit; family placed belongings in locker 1 and provided key to retrieve after visit; Sitter remains at bedside and door left open while family visit-Monique,RN

## 2018-06-07 NOTE — ED Notes (Signed)
Tray was ordered @ 1639

## 2018-06-07 NOTE — ED Notes (Signed)
Update given to poison control

## 2018-06-07 NOTE — ED Provider Notes (Addendum)
[facility] Note   CSN: [id number] Arrival date & time: 06/07/18  1429     History   Chief Complaint Chief Complaint  Patient presents with  . Suicidal  . Ingestion    HPI Stacey Sparks is a 37 y.o. female.  37 year old female with prior history as below presents with complaint of suicidal attempt.  Patient reports that around 1:00 or 130 this afternoon she took a handful of Benadryl and Robaxin.  She is uncertain as to how much she took.  She reports that she just wanted to "go to sleep."  She reports a prior suicide attempt when she was much younger.  She reports increasing symptoms of depression over the last several days to weeks.  She denies coingestion.  The history is provided by the patient.  Ingestion  This is a new problem. The current episode started 3 to 5 hours ago. The problem occurs constantly. The problem has not changed since onset.Pertinent negatives include no chest pain, no abdominal pain, no headaches and no shortness of breath. Nothing aggravates the symptoms. Nothing relieves the symptoms. She has tried nothing for the symptoms.    Past Medical History:  Diagnosis Date  . Abnormal Pap smear   . Headache(784.0)    migraine  . Ovarian cyst   . Preeclampsia postpartum  . S/P tubal ligation 01/24/2012    There are no active problems to display for this patient.   Past Surgical History:  Procedure Laterality Date  . FEMUR SURGERY  1996  . TUBAL LIGATION  01/24/2012   Procedure: POST PARTUM TUBAL LIGATION;  Surgeon: Sherron MondayJody Bovard, MD;  Location: [facility];  Service: Gynecology;  Laterality: Bilateral;     OB History    Gravida  6   Para  4   Term  4   Preterm  0   AB  2   Living  4     SAB  1   TAB  1   Ectopic  0   Multiple  0   Live Births  4            Home Medications    Prior to Admission medications   Medication Sig Start Date End Date Taking? Authorizing Provider    aspirin-acetaminophen-caffeine (EXCEDRIN MIGRAINE) [phone] MG tablet Take 2 tablets by mouth daily as needed for headache.   Yes [provider]    Family History Family History  Problem Relation Age of Onset  . Arthritis Mother   . Diabetes Mother   . Migraines Brother   . Diabetes Maternal Aunt   . Mental retardation Maternal Aunt        schizophrenia  . Diabetes Maternal Uncle     Social History Social History   Tobacco Use  . Smoking status: Never Smoker  . Smokeless tobacco: Never Used  Substance Use Topics  . Alcohol use: No  . Drug use: No     Allergies   Patient has no known allergies.   Review of Systems Review of Systems  Respiratory: Negative for shortness of breath.   Cardiovascular: Negative for chest pain.  Gastrointestinal: Negative for abdominal pain.  Neurological: Negative for headaches.  Psychiatric/Behavioral: Positive for suicidal ideas.  All other systems reviewed and are negative.    Physical Exam Updated Vital Signs BP 109/77 (BP Location: Right Arm)   Pulse 78   Temp 98.7 F (37.1 C) (Oral)   Resp 16   Ht 5\' 7"  (  1.702 m)   Wt 74.8 kg (165 lb)   LMP 05/17/2018   SpO2 100%   BMI 25.84 kg/m   Physical Exam  Constitutional: She is oriented to person, place, and time. She appears well-developed and well-nourished. No distress.  HENT:  Head: Normocephalic and atraumatic.  Mouth/Throat: Oropharynx is clear and moist.  Eyes: Pupils are equal, round, and reactive to light. Conjunctivae and EOM are normal.  Neck: Normal range of motion. Neck supple.  Cardiovascular: Normal rate, regular rhythm and normal heart sounds.  Pulmonary/Chest: Effort normal and breath sounds normal. No respiratory distress.  Abdominal: Soft. She exhibits no distension. There is no tenderness.  Musculoskeletal: Normal range of motion. She exhibits no edema or deformity.  Neurological: She is alert and oriented to person, place, and time.  Skin:  Skin is warm and dry.  Psychiatric: She has a normal mood and affect.  Tearful  Reports SI with plan to OD   Nursing note and vitals reviewed.    ED Treatments / Results  Labs (all labs ordered are listed, but only abnormal results are displayed) Labs Reviewed  ACETAMINOPHEN LEVEL - Abnormal; Notable for the following components:      Result Value   Acetaminophen (Tylenol), Serum <10 (*)    All other components within normal limits  CBC WITH DIFFERENTIAL/PLATELET - Abnormal; Notable for the following components:   RBC 3.64 (*)    MCV 106.9 (*)    All other components within normal limits  COMPREHENSIVE METABOLIC PANEL  SALICYLATE LEVEL  ETHANOL  RAPID URINE DRUG SCREEN, HOSP PERFORMED  I-STAT BETA HCG BLOOD, ED ([facility], [facility], [facility] ONLY)  CBG MONITORING, ED    EKG EKG Interpretation  Date/Time:  Monday June 07 2018 15:04:24 EDT Ventricular Rate:  77 PR Interval:  134 QRS Duration: 70 QT Interval:  368 QTC Calculation: 416 R Axis:   72 Text Interpretation:  Normal sinus rhythm Normal ECG Confirmed by Kristine Royal [phone]) on 06/07/2018 3:49:29 PM   Radiology No results found.  Procedures Procedures (including critical care time)  Medications Ordered in ED Medications - No data to display   Initial Impression / Assessment and Plan / ED Course  I have reviewed the triage vital signs and the nursing notes.  Pertinent labs & imaging results that were available during my care of the patient were reviewed by me and considered in my medical decision making (see chart for details).     MDM  Screen complete  Patient presents following reported overdose in a suicide attempt.  Patient does not appear to be having significant effects from her reported OD.  Screening labs are obtained and do not reveal significant other acute pathology.  IVC for patient safety is obtained.   Patient requires further psychiatric treatment and evaluation.  Patient is medically clear at this  time for further psychiatric treatment.    Final disposition is dependent on psychiatry.    Final Clinical Impressions(s) / ED Diagnoses   Final diagnoses:  None    ED Discharge Orders    None       Wynetta Fines, MD 06/07/18 1750    Wynetta Fines, MD 06/07/18 [phone]

## 2018-06-07 NOTE — ED Notes (Signed)
Discussed process with family. Significant other nleavews number.

## 2018-06-07 NOTE — ED Notes (Signed)
Pt resting comfortably in bed

## 2018-06-07 NOTE — ED Provider Notes (Signed)
Patient placed in Quick Look pathway, seen and evaluated   Chief Complaint: Pt took overdose of several medicaitons  HPI:   Pt has been depressed.   ROS: no vomiting   Physical Exam:   Gen: No distress  Neuro: Awake and Alert  Skin: Warm    Focused Exam: Lungs clear, Heart RRr   Initiation of care has begun. The patient has been counseled on the process, plan, and necessity for staying for the completion/evaluation, and the remainder of the medical screening examination   Osie CheeksSofia, Herb Beltre K, PA-C 06/07/18 1456    Shaune PollackIsaacs, Cameron, MD 06/07/18 2024

## 2018-06-07 NOTE — ED Notes (Signed)
Patient's husband has retrieved items from locker #1 and has been updated on patient's care; husband has left for the night and left call back # as [phone] Fayrene Fearing(James Smith)-Monique,RN

## 2018-06-07 NOTE — ED Triage Notes (Signed)
wanded pt. By security

## 2018-06-07 NOTE — ED Triage Notes (Signed)
Called for sitter, No sitter available at this time. Called security for wanding

## 2018-06-07 NOTE — ED Notes (Signed)
IVC paperwork accepted; Law enforcement notified and on the way to serve papers

## 2018-06-08 ENCOUNTER — Other Ambulatory Visit: Payer: Self-pay

## 2018-06-08 ENCOUNTER — Inpatient Hospital Stay (HOSPITAL_COMMUNITY)
Admission: AD | Admit: 2018-06-08 | Discharge: 2018-06-10 | DRG: 885 | Disposition: A | Discharge status: Home / Self Care | Payer: Federal, State, Local not specified - Other | Source: Intra-hospital | Attending: Psychiatry | Admitting: Psychiatry

## 2018-06-08 ENCOUNTER — Encounter (HOSPITAL_COMMUNITY): Payer: Self-pay

## 2018-06-08 DIAGNOSIS — F1290 Cannabis use, unspecified, uncomplicated: Secondary | ICD-10-CM | POA: Diagnosis present

## 2018-06-08 DIAGNOSIS — F1994 Other psychoactive substance use, unspecified with psychoactive substance-induced mood disorder: Secondary | ICD-10-CM

## 2018-06-08 DIAGNOSIS — Z9851 Tubal ligation status: Secondary | ICD-10-CM

## 2018-06-08 DIAGNOSIS — Z915 Personal history of self-harm: Secondary | ICD-10-CM

## 2018-06-08 DIAGNOSIS — F332 Major depressive disorder, recurrent severe without psychotic features: Principal | ICD-10-CM | POA: Diagnosis present

## 2018-06-08 DIAGNOSIS — R45851 Suicidal ideations: Secondary | ICD-10-CM | POA: Diagnosis present

## 2018-06-08 DIAGNOSIS — G4700 Insomnia, unspecified: Secondary | ICD-10-CM | POA: Diagnosis present

## 2018-06-08 DIAGNOSIS — Z818 Family history of other mental and behavioral disorders: Secondary | ICD-10-CM

## 2018-06-08 MED ORDER — HYDROXYZINE HCL 25 MG PO TABS: 25.0000 mg | ORAL_TABLET | Freq: Three times a day (TID) | ORAL | Status: DC | PRN

## 2018-06-08 MED ORDER — ONDANSETRON 4 MG PO TBDP: 4.0000 mg | ORAL_TABLET | Freq: Four times a day (QID) | ORAL | Status: DC | PRN

## 2018-06-08 MED ORDER — MAGNESIUM HYDROXIDE 400 MG/5ML PO SUSP: 30.0000 mL | Freq: Every day | ORAL | Status: DC | PRN

## 2018-06-08 MED ORDER — ALUM & MAG HYDROXIDE-SIMETH 200-200-20 MG/5ML PO SUSP: 30.0000 mL | ORAL | Status: DC | PRN

## 2018-06-08 MED ORDER — LOPERAMIDE HCL 2 MG PO CAPS: 2.0000 mg | ORAL_CAPSULE | ORAL | Status: DC | PRN

## 2018-06-08 MED ORDER — CHLORDIAZEPOXIDE HCL 25 MG PO CAPS: 25.0000 mg | ORAL_CAPSULE | Freq: Four times a day (QID) | ORAL | Status: DC | PRN

## 2018-06-08 MED ORDER — ACETAMINOPHEN 325 MG PO TABS: 650.0000 mg | ORAL_TABLET | Freq: Four times a day (QID) | ORAL | Status: DC | PRN

## 2018-06-08 MED ORDER — TRAZODONE HCL 50 MG PO TABS: 50.0000 mg | ORAL_TABLET | Freq: Every evening | ORAL | Status: DC | PRN

## 2018-06-08 MED ORDER — ADULT MULTIVITAMIN W/MINERALS CH
1.0000 | ORAL_TABLET | Freq: Every day | ORAL | Status: DC
  Filled 2018-06-08 (×4): qty 1

## 2018-06-08 MED ORDER — VITAMIN B-1 100 MG PO TABS
100.0000 mg | ORAL_TABLET | Freq: Every day | ORAL | Status: DC
  Filled 2018-06-08 (×4): qty 1

## 2018-06-08 NOTE — Progress Notes (Signed)
D: Patient observed up and around unit but keeps to self. Restless at times. Patient states, "I shouldn't be here. I wasn't trying to hurt myself. I just wanted a break. I can't be here and miss school." Patient minimizes substance use stating, "oh that was yesterday. I was half asleep. I don't even remember." Patient's affect blunted, mood irritable but cooperative.  Denies pain, physical complaints.   A: Prn medications offered should patient feel anxious, have trouble sleeping, etc. however patient refused. Level III obs in place for safety. Emotional support offered. Patient encouraged to complete Suicide Safety Plan before discharge. Encouraged to attend and participate in unit programming.    R: Patient verbalizes understanding of POC. Patient denies SI/HI/AVH and remains safe on level III obs. Will continue to monitor throughout the night.

## 2018-06-08 NOTE — ED Notes (Addendum)
Family at bedsdie. [facility] an [facility]. Pt and family informed of same. Family ask"What if she doesn't want to go. " Explained that magistrate order would apply. Pt states "you cant' talk to her (referring to this nurse). Just get out of my room." This nurse leaves and pt and family continue speaking calmly.

## 2018-06-08 NOTE — ED Notes (Signed)
Pt eating eggs but states she does not eat pork.

## 2018-06-08 NOTE — ED Notes (Addendum)
Pt oob to phone. Appears to leave message but whispering into phone. Stable gait.

## 2018-06-08 NOTE — ED Notes (Signed)
Dinner tray delivered to patient. Patient did not eat any of it.

## 2018-06-08 NOTE — ED Notes (Signed)
A Snack was placed at patient bedside.

## 2018-06-08 NOTE — ED Notes (Signed)
Pt offered other food and pt declines offer.

## 2018-06-08 NOTE — ED Notes (Signed)
[employer] called for transport 

## 2018-06-08 NOTE — Progress Notes (Signed)
[facility] Stacey KaufmannLaura Davis, NP is the accepting provider.   Dr. Jola Babinskilary is the attending provider.   Call report to [phone]  Pt is involuntary and will be transported by law enforcement. [facility].   Wells GuilesSarah Yliana Gravois, LCSW, LCAS Disposition CSW [facility] [phone]

## 2018-06-08 NOTE — ED Notes (Signed)
Spoke at length with patient's husband Stacey Sparks -- concerning pt's status- per patient- ok to share information,  [facility]  [phone].

## 2018-06-08 NOTE — ED Notes (Signed)
Husband at bedside.  
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
## 2018-06-08 NOTE — ED Notes (Signed)
Ordered reg tray/no sharps 

## 2018-06-08 NOTE — ED Notes (Signed)
Regular Diet was ordered for Lunch. 

## 2018-06-08 NOTE — Progress Notes (Signed)
Pt did not attend AA group this evening.  

## 2018-06-08 NOTE — Tx Team (Signed)
Initial Treatment Plan 06/08/2018 7:57 PM Stacey Sparks [id number]    PATIENT STRESSORS: Educational concerns Loss of job Substance abuse   PATIENT STRENGTHS: Ability for insight Average or above average intelligence Capable of independent living Manufacturing systems engineerCommunication skills Physical Health Supportive family/friends   PATIENT IDENTIFIED PROBLEMS:   "feeling overwhelmed"    "substance abuse"    "unable to cope"           DISCHARGE CRITERIA:  Improved stabilization in mood, thinking, and/or behavior Reduction of life-threatening or endangering symptoms to within safe limits Verbal commitment to aftercare and medication compliance  PRELIMINARY DISCHARGE PLAN: Outpatient therapy Return to previous living arrangement Return to previous work or school arrangements  PATIENT/FAMILY INVOLVEMENT: This treatment plan has been presented to and reviewed with the patient, Stacey Sparks,   The patient has been given the opportunity to ask questions and make suggestions.  [name], RN 06/08/2018, 7:57 PM

## 2018-06-08 NOTE — ED Notes (Addendum)
Husband at bedsdie and questioning process. States she wants to leave. States she is leaving. IVC  Noted and pt and family requesting information. Unsattisfied with my response and ask to speak with charge nurse. Same informed.

## 2018-06-08 NOTE — ED Notes (Signed)
[facility] arrived to transport patient to [facility] 

## 2018-06-09 ENCOUNTER — Encounter (HOSPITAL_COMMUNITY): Payer: Self-pay | Admitting: Behavioral Health

## 2018-06-09 LAB — TSH: TSH: 1.662 u[IU]/mL (ref 0.350–4.500)

## 2018-06-09 NOTE — Progress Notes (Addendum)
D: Patient denies SI, HI or AVH this evening. Patient presents as anxious but cooperative.  She states that this admission is a "misunderstanding".  States that she was never attempting to harm herself and that her husband was "just concerned, he is a good man".  Pt. States she is anticipating discharge tomorrow morning.  She denies any physical complaints.  A: Patient given emotional support from RN. Patient encouraged to come to staff with concerns and/or questions.  Patient's orders and plan of care reviewed.   R: Patient remains appropriate and cooperative. Will continue to monitor patient q15 minutes for safety.

## 2018-06-09 NOTE — Plan of Care (Signed)
  Problem: Safety: Goal: Periods of time without injury will increase Outcome: Progressing   Problem: Self-Concept: Goal: Ability to disclose and discuss suicidal ideas will improve Outcome: Progressing   Problem: Coping: Goal: Will verbalize feelings Outcome: Progressing   Problem: Safety: Goal: Ability to disclose and discuss suicidal ideas will improve Outcome: Progressing

## 2018-06-09 NOTE — Progress Notes (Signed)
Patient was anxious, depressed, and tearful upon approach. Patient told writer "I don't need to be here. I have an autistic child who needs me to take care of him and i'm missing school. I will not take any medications, I don't need any." Support and encouragement was given. MD notified of patient's tearful episode. Denies SI HI AVH.  Safety maintained with 15 minute checks as well as environmental checks. Will continue to monitor.

## 2018-06-09 NOTE — [facility] Suicide Risk Assessment (Signed)
[facility] INPATIENT:  Family/Significant Other Suicide Prevention Education  Suicide Prevention Education:  Education Completed; Stacey Sparks (pt's husband) [phone] has been identified by the patient as the family member/significant other with whom the patient will be residing, and identified as the person(s) who will aid the patient in the event of a mental health crisis (suicidal ideations/suicide attempt).  With written consent from the patient, the family member/significant other has been provided the following suicide prevention education, prior to the and/or following the discharge of the patient.  The suicide prevention education provided includes the following:  Suicide risk factors  Suicide prevention and interventions  National Suicide Hotline telephone number  [facility] assessment telephone number  [facility] Assistance 911  [facility] and/or [facility]idential Mobile Crisis Unit telephone number  Request made of family/significant other to:  Remove weapons (e.g., guns, rifles, knives), all items previously/currently identified as safety concern.    Remove drugs/medications (over-the-counter, prescriptions, illicit drugs), all items previously/currently identified as a safety concern.  The family member/significant other verbalizes understanding of the suicide prevention education information provided.  The family member/significant other agrees to remove the items of safety concern listed above.  SPE and aftercare plan reviewed with pt's husband. He has no concerns regarding pt discharging home tomorrow. He plans to visit with pt this evening to work on a Water engineersafety plan. Pt's husband plans to pick her up at 11am on 7/25. MD made aware and is agreeable to discharge plan.   Rona RavensHeather S Skipper Dacosta LCSW 06/09/2018, 3:18 PM

## 2018-06-09 NOTE — Progress Notes (Signed)
Recreation Therapy Notes  Date: 7.24.19 Time: 0930 Location: [address] Dayroom  Group Topic: Stress Management  Goal Area(s) Addresses:  Patient will verbalize importance of using healthy stress management.  Patient will identify positive emotions associated with healthy stress management.   Intervention: Stress Management  Activity : Meditation.  LRT introduced the stress management technique of meditation.  LRT played a meditation that focused on choices.  Patients were to follow along as meditation was read to fully engage in the activity.  Education:  Stress Management, Discharge Planning.   Education Outcome: Acknowledges edcuation/In group clarification offered/Needs additional education  Clinical Observations/Feedback: Pt did not attend group.    Caroll RancherMarjette Kamori Barbier, LRT/CTRS         Lillia AbedLindsay, Vickki Igou A 06/09/2018 10:54 AM

## 2018-06-09 NOTE — [facility] Suicide Risk Assessment (Signed)
[facility]   Nursing information obtained from:  Patient Demographic factors:  Unemployed, Low socioeconomic status Current Mental Status:  Suicidal ideation indicated by others Loss Factors:  Financial problems / change in socioeconomic status Historical Factors:  Prior suicide attempts Risk Reduction Factors:  Sense of responsibility to family, Positive social support, Responsible for children under 39 years of age, Living with another person, especially a relative  Total Time spent with patient: 1 hour Principal Problem: Severe recurrent major depression without psychotic features (HCC) Diagnosis:   Patient Active Problem List   Diagnosis Date Noted  . Severe recurrent major depression without psychotic features (HCC) [F33.2] 06/08/2018    History of Present Illness:  Stacey Sparks is a 37 year old black female with a history of MDD and substance use disorder who presents under IVC from the ER for suicidal ideation after overdose on Benadryl and muscle relaxants. Patient was found by husband on Monday after having taken 20 OTC Benadryl and 10 muscle relaxants (obtained from friend), who then called 911 out of concern for overdose. Patient was then transferred here yesterday. Patient reports that she took the drugs over a period of 3-4 hours to help her sleep due to recent stress from her school work and denied any suicidal ideation or homicidal ideation. Patient states [facility] and needs to go home to take care of her kids, especially her autistic six-year-old son. Patient states there is nothing we can do here to help her except discharge her. Patient states she has missed her midterm due to this ordeal, and has upcoming family vacation plans to the beach this weekend. Patient endorsed feeling sad and depressed, getting little sleep, and having low energy and decreased appetite [facility] but denied any of these symptoms before  arrival. Patient denies any hallucinations (visual, auditory), symptoms of mania, anxiety, OCD, or PTSD. Patient admits to smoking marijuana regularly and trying cocaine/ecstasy for the first time this past weekend. Collateral from husband confirms that he initially called 911 for concern of overdose but not suicidal ideation or other psychiatric illness. Husband states the situation has gotten out of hand and wants patient to return home as soon as possible.  On evaluation, patient endorses "using uppers and downers to get trough the day, and buys them from dealers on the street."' She does not remember saying that she was trying to kill herself.  Family meeting held with husband who reports he is collecting medication in house so that she can not have access. She is agreeable to counseling for stress management and substance abuse support groups.  She denies SI, HI. AVH.   Continued Clinical Symptoms:  Alcohol Use Disorder Identification Test Final Score (AUDIT): 12 The "Alcohol Use Disorders Identification Test", Guidelines for Use in Primary Care, Second Edition.  World Science writerHealth Organization [facility](WHO). Score between 0-7:  no or low risk or alcohol related problems. Score between 8-15:  moderate risk of alcohol related problems. Score between 16-19:  high risk of alcohol related problems. Score 20 or above:  warrants further diagnostic evaluation for alcohol dependence and treatment.   CLINICAL FACTORS:   Depression:   Comorbid alcohol abuse/dependence Alcohol/Substance Abuse/Dependencies   Musculoskeletal: Strength & Muscle Tone: within normal limits Gait & Station: normal Patient leans: N/A  Psychiatric Specialty Exam: Physical Exam  Nursing note and vitals reviewed. Constitutional: She is oriented to person, place, and time. She appears well-developed and well-nourished. No distress.  Tattoos on limbs  Respiratory: Effort normal.  Musculoskeletal: Normal range of motion.  Neurological:  She is alert and oriented to person, place, and time.  Psychiatric: She has a normal mood and affect. Her behavior is normal.    Review of Systems  Psychiatric/Behavioral: Positive for depression and substance abuse. Negative for hallucinations, memory loss and suicidal ideas. The patient has insomnia. The patient is not nervous/anxious.     Blood pressure 103/75, pulse 93, temperature 98.4 F (36.9 C), temperature source Oral, resp. rate 18, height 5\' 7"  (1.702 m), weight 160 lb (72.6 kg), last menstrual period 05/17/2018, SpO2 100 %, unknown if currently breastfeeding.Body mass index is 25.06 kg/m.  General Appearance: Casual and Well Groomed  Eye Contact:  Good  Speech:  Normal Rate  Volume:  Normal  Mood:  Depressed  Affect:  Appropriate, Congruent, Depressed and Tearful  Thought Process:  Coherent, Goal Directed, Linear and Descriptions of Associations: Intact  Orientation:  Full (Time, Place, and Person)  Thought Content:  Logical  Suicidal Thoughts:  No  Homicidal Thoughts:  No  Memory:  Immediate;   Good Recent;   Good Remote;   Good  Judgement:  Poor  Insight:  Fair  Psychomotor Activity:  Normal  Concentration:  Concentration: Good and Attention Span: Good  Recall:  Good  Fund of Knowledge:  Good  Language:  Good  Akathisia:  No  AIMS (if indicated):     Assets:  Communication Skills Desire for Improvement Financial Resources/Insurance Housing Intimacy Leisure Time Physical Health Social Support Transportation Vocational/Educational  ADL's:  Intact  Cognition:  WNL  Sleep:  Number of Hours: 5.75    COGNITIVE FEATURES THAT CONTRIBUTE TO RISK:  None    SUICIDE RISK:   Minimal: No identifiable suicidal ideation.  Patients presenting with no risk factors but with morbid ruminations; may be classified as minimal risk based on the severity of the depressive symptoms  PLAN OF CARE:   Treatment Plan Summary: Daily contact with patient to assess and  evaluate symptoms and progress in treatment and Medication management  Observation Level/Precautions:  15 minute checks  Laboratory:  CBC Chemistry Profile UDS  Psychotherapy:    Medications:    Consultations:    Discharge Concerns:    Estimated LOS:  Other:     Physician Treatment Plan for Primary Diagnosis: Severe recurrent major depression without psychotic features (HCC) Long Term Goal(s): Improvement in symptoms so as ready for discharge  Short Term Goals: Ability to identify changes in lifestyle to reduce recurrence of condition will improve, Ability to demonstrate self-control will improve and Ability to identify triggers associated with substance abuse/mental health issues will improve  Physician Treatment Plan for Secondary Diagnosis: Principal Problem:   Severe recurrent major depression without psychotic features (HCC)  I certify that inpatient services furnished can reasonably be expected to improve the patient's condition      I certify that inpatient services furnished can reasonably be expected to improve the patient's condition.   [name], MD 06/09/2018, 6:26 PM

## 2018-06-09 NOTE — Tx Team (Signed)
Interdisciplinary Treatment and Diagnostic Plan Update  06/09/2018 Time of Session: [place] MRN: [id number]  Principal Diagnosis: MDD, recurrent, severe, without psychotic features  Secondary Diagnoses: Active Problems:   Severe recurrent major depression without psychotic features (HCC)   Current Medications:  Current Facility-Administered Medications  Medication Dose Route Frequency Provider Last Rate Last Dose  . acetaminophen (TYLENOL) tablet 650 mg  650 mg Oral Q6H PRN Rozetta Nunnery, NP      . alum & mag hydroxide-simeth (MAALOX/MYLANTA) 200-200-20 MG/5ML suspension 30 mL  30 mL Oral Q4H PRN Lindon Romp A, NP      . chlordiazePOXIDE (LIBRIUM) capsule 25 mg  25 mg Oral Q6H PRN Lindon Romp A, NP      . hydrOXYzine (ATARAX/VISTARIL) tablet 25 mg  25 mg Oral TID PRN Lindon Romp A, NP      . loperamide (IMODIUM) capsule 2-4 mg  2-4 mg Oral PRN Lindon Romp A, NP      . magnesium hydroxide (MILK OF MAGNESIA) suspension 30 mL  30 mL Oral Daily PRN Lindon Romp A, NP      . multivitamin with minerals tablet 1 tablet  1 tablet Oral Daily Lindon Romp A, NP      . ondansetron (ZOFRAN-ODT) disintegrating tablet 4 mg  4 mg Oral Q6H PRN Lindon Romp A, NP      . thiamine (VITAMIN B-1) tablet 100 mg  100 mg Oral Daily Lindon Romp A, NP      . traZODone (DESYREL) tablet 50 mg  50 mg Oral QHS PRN Rozetta Nunnery, NP       PTA Medications: Medications Prior to Admission  Medication Sig Dispense Refill Last Dose  . aspirin-acetaminophen-caffeine (EXCEDRIN MIGRAINE) 250-250-65 MG tablet Take 2 tablets by mouth daily as needed for headache.   06/02/2018    Patient Stressors: Educational concerns Loss of job Substance abuse  Patient Strengths: Ability for insight Average or above average intelligence Capable of independent living Armed forces logistics/support/administrative officer Physical Health Supportive family/friends  Treatment Modalities: Medication Management, Group therapy, Case management,   1 to 1 session with clinician, Psychoeducation, Recreational therapy.  Physician Treatment Plan for Primary Diagnosis: MDD, recurrent, severe, without psychotic features   Medication Management: Evaluate patient's response, side effects, and tolerance of medication regimen.  Therapeutic Interventions: 1 to 1 sessions, Unit Group sessions and Medication administration.  Evaluation of Outcomes: Not Met  Physician Treatment Plan for Secondary Diagnosis: Active Problems:   Severe recurrent major depression without psychotic features ([place])  Medication Management: Evaluate patient's response, side effects, and tolerance of medication regimen.  Therapeutic Interventions: 1 to 1 sessions, Unit Group sessions and Medication administration.  Evaluation of Outcomes: Not Met   RN Treatment Plan for Primary Diagnosis: MDD, recurrent, severe, without psychotic features Long Term Goal(s): Knowledge of disease and therapeutic regimen to maintain health will improve  Short Term Goals: Ability to remain free from injury will improve, Ability to demonstrate self-control, Ability to disclose and discuss suicidal ideas and Ability to identify and develop effective coping behaviors will improve  Medication Management: RN will administer medications as ordered by provider, will assess and evaluate patient's response and provide education to patient for prescribed medication. RN will report any adverse and/or side effects to prescribing provider.  Therapeutic Interventions: 1 on 1 counseling sessions, Psychoeducation, Medication administration, Evaluate responses to treatment, Monitor vital signs and CBGs as ordered, Perform/monitor CIWA, COWS, AIMS and Fall Risk screenings as ordered, Perform wound care treatments as ordered.  Evaluation of Outcomes: Not Met   LCSW Treatment Plan for Primary Diagnosis: MDD, recurrent, severe, without psychotic features Long Term Goal(s): Safe transition to appropriate  next level of care at discharge, Engage patient in therapeutic group addressing interpersonal concerns.  Short Term Goals: Engage patient in aftercare planning with referrals and resources, Facilitate patient progression through stages of change regarding substance use diagnoses and concerns and Identify triggers associated with mental health/substance abuse issues  Therapeutic Interventions: Assess for all discharge needs, 1 to 1 time with Social worker, Explore available resources and support systems, Assess for adequacy in community support network, Educate family and significant other(s) on suicide prevention, Complete Psychosocial Assessment, Interpersonal group therapy.  Evaluation of Outcomes: Not Met   Progress in Treatment: Attending groups: No. New to unit. Continuing to assess.  Participating in groups: No. Taking medication as prescribed: Yes. Toleration medication: Yes. Family/Significant other contact made: No, will contact:  family member/husband for collateral information and to complete SPE if pt consents Patient understands diagnosis: Yes. Discussing patient identified problems/goals with staff: Yes. Medical problems stabilized or resolved: Yes. Denies suicidal/homicidal ideation: Yes. Issues/concerns per patient self-inventory: No. Other: n/a   New problem(s) identified: Yes, Describe:  pt is crying on unit and wanting to discharge home.  New Short Term/Long Term Goal(s): detox, medication management for mood stabilization; elimination of SI thoughts; development of comprehensive mental wellness/sobriety plan.   Patient Goals:  "To stop feeling overwhelmed and like I can't cope with things."   Discharge Plan or Barriers: CSW assessing for appropriate referrals. White pamphlet, Mobile Crisis information, and AA/NA information provided to patient for additional community support and resources.   Reason for Continuation of Hospitalization: Anxiety Depression Medication  stabilization Withdrawal symptoms  Estimated Length of Stay: Friday, 06/11/18  Attendees: Patient:  06/09/2018 8:35 AM  Physician: Dr. Parke Poisson MD; Dr. Leverne Humbles MD  06/09/2018 8:35 AM  Nursing: Yetta Flock RN; Roni RN 06/09/2018 8:35 AM  RN Care Manager:x 06/09/2018 8:35 AM  Social Worker: Janice Norrie LCSW 06/09/2018 8:35 AM  Recreational Therapist: x 06/09/2018 8:35 AM  Other: Lindell Spar NP; Benjamine Mola NP 06/09/2018 8:35 AM  Other:  06/09/2018 8:35 AM  Other: 06/09/2018 8:35 AM    Scribe for Treatment Team: Avelina Laine, LCSW 06/09/2018 8:35 AM
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
## 2018-06-09 NOTE — [facility] Group Notes (Signed)
LCSW Group Therapy Note   06/09/2018 1:15pm   Type of Therapy and Topic:  Group Therapy:  Overcoming Obstacles   Participation Level:  Did Not Attend--pt meeting with staff member during group. Excused.    Description of Group:    In this group patients will be encouraged to explore what they see as obstacles to their own wellness and recovery. They will be guided to discuss their thoughts, feelings, and behaviors related to these obstacles. The group will process together ways to cope with barriers, with attention given to specific choices patients can make. Each patient will be challenged to identify changes they are motivated to make in order to overcome their obstacles. This group will be process-oriented, with patients participating in exploration of their own experiences as well as giving and receiving support and challenge from other group members.   Therapeutic Goals: 1. Patient will identify personal and current obstacles as they relate to admission. 2. Patient will identify barriers that currently interfere with their wellness or overcoming obstacles.  3. Patient will identify feelings, thought process and behaviors related to these barriers. 4. Patient will identify two changes they are willing to make to overcome these obstacles:      Summary of Patient Progress     x Therapeutic Modalities:   Cognitive Behavioral Therapy Solution Focused Therapy Motivational Interviewing Relapse Prevention Therapy  Rona RavensHeather S Kynadie Yaun, LCSW 06/09/2018 2:45 PM

## 2018-06-09 NOTE — H&P (Signed)
Psychiatric Admission Assessment Adult  Patient Identification: Stacey Sparks MRN:  [id number] Date of Evaluation:  06/09/2018 Chief Complaint:  mdd Principal Diagnosis: Severe recurrent major depression without psychotic features [facility]) Diagnosis:   Patient Active Problem List   Diagnosis Date Noted  . Severe recurrent major depression without psychotic features (HCC) [F33.2] 06/08/2018   History of Present Illness:  Stacey Sparks is a 37 year old black female with a history of MDD and substance use disorder who presents under IVC from the ER for suicidal ideation after overdose on Benadryl and muscle relaxants. Patient was found by husband on Monday after having taken 20 OTC Benadryl and 10 muscle relaxants (obtained from friend), who then called 911 out of concern for overdose.   On initial evaluation by medical student, Kearney Hard, patient reports that she took the drugs over a period of 3-4 hours to help her sleep due to recent stress from her school work and denied any suicidal ideation or homicidal ideation. Patient states she does not belong at the [facility] and needs to go home to take care of her kids, especially her autistic six-year-old son. Patient states "there is nothing we can do here to help her except discharge her". Patient states she has missed her midterm due to this ordeal, and has upcoming family vacation plans to the beach this weekend. Patient endorsed feeling sad and depressed, getting little sleep, and having low energy and decreased appetite since her arrival at [facility] but denied any of these symptoms before arrival. Patient denies any AVH, symptoms of mania, anxiety, OCD, or PTSD. Patient admits to smoking marijuana regularly and trying cocaine/ecstasy for the first time this past weekend. Collateral from husband confirms that he initially called 911 for concern of overdose but not suicidal ideation or other psychiatric illness. Husband states the situation has gotten out of  hand and wants patient to return home as soon as possible.  HPI from triage assessment:  Stacey Sparks is a 37 y.o. female who came to [facility] following suicide attempt by overdosing on medication. Pt shared that she took approximately 15 Benadryl pills and approximately 5 muscle relaxers due to getting depressed. Pt verified to clinician that her goal for taking this medication was to kill herself. Pt states that she did the same thing when she was in middle school; she states that she went to the hospital, had her stomach pumped, and they sent her home (no follow-up with behavioral health). Pt expressed she would like to go home now and that she now knows better. Pt is now denying SI. She also denies HI, NSSIB, and AVH. Pt states she has never worked with a Paramedic nor with a psychiatrist. She denies any access to weapons, and her husband verified this, and she denies any involvement with the court system. Pt shares she smokes two joints of marijuana on a daily basis and that she last smoked today. She shares she drinks alcohol approximately 2x / week and that, when she drinks, she drinks "a lot." Pt estimates she drinks approximately a pink of liquor when she drinks. She states the last time she drank was Saturday, June 05, 2018. 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 Pt's mother stated she believes pt has been stressed/depressed because she has not been working. Pt is married and she lives with her husband and her children; she states her husband is her greatest support. Pt  denies any family members having mental health disorders or attempting to kill themselves. She states seh has a maternal aunt that has abused substances, though she is not sure of what she uses. Pt denies ever experiencing abuse.  Pt is oriented x4. Her recent and remote memory is intact. Pt was cooperative throughout her assessment, though she and her  mother and husband all stated they would like pt to go home. When clinician probed pt's husband in regards to his thoughts regarding pt's suicide attempt, he stated it hasn't happened before, so he believes this is a one-time thing, but when gently pushed further, he began to cry and required consolation from his mother-in-law and then his wife. Pt's insight, judgement, and impulse control is poor at this time.  Associated Signs/Symptoms: Depression Symptoms:  depressed mood, insomnia, difficulty concentrating, loss of energy/fatigue, disturbed sleep, decreased appetite, (Hypo) Manic Symptoms:  Distractibility, Anxiety Symptoms:  Denies Psychotic Symptoms:  Denies PTSD Symptoms: Negative Total Time spent with patient: 60 minutes  Past Psychiatric History: severe recurrent MDD without psychotic features; substance use disorder; Suicide attempt by overdose at 37 years  Is the patient at risk to self? yes Has the patient been a risk to self in the past 6 months? No.  Has the patient been a risk to self within the distant past? Yes.    Is the patient a risk to others? No.  Has the patient been a risk to others in the past 6 months? No.  Has the patient been a risk to others within the distant past? Yes.     Prior Inpatient Therapy:  No, however "had stomach pumped after overdose as teen" Prior Outpatient Therapy:  No  Alcohol Screening: 1. How often do you have a drink containing alcohol?: 2 to 3 times a week 2. How many drinks containing alcohol do you have on a typical day when you are drinking?: 7, 8, or 9 3. How often do you have six or more drinks on one occasion?: Weekly AUDIT-C Score: 9 4. How often during the last year have you found that you were not able to stop drinking once you had started?: Weekly 5. How often during the last year have you failed to do what was normally expected from you because of drinking?: Never 6. How often during the last year have you needed a  first drink in the morning to get yourself going after a heavy drinking session?: Never 7. How often during the last year have you had a feeling of guilt of remorse after drinking?: Never 8. How often during the last year have you been unable to remember what happened the night before because you had been drinking?: Never 9. Have you or someone else been injured as a result of your drinking?: No 10. Has a relative or friend or a doctor or another health worker been concerned about your drinking or suggested you cut down?: No Alcohol Use Disorder Identification Test Final Score (AUDIT): 12 Intervention/Follow-up: Patient Refused Substance Abuse History in the last 12 months:  Yes.   Consequences of Substance Abuse: Medical admissions; Inability to care for child Previous Psychotropic Medications: No  Psychological Evaluations: No  Past Medical History:      Past Medical History:  Diagnosis Date  . Abnormal Pap smear   . Headache(784.0)    migraine  . Ovarian cyst   . Preeclampsia postpartum  . S/P tubal ligation 01/24/2012         Past Surgical History:  Procedure Laterality Date  . FEMUR SURGERY  1996  . TUBAL LIGATION  01/24/2012   Procedure: POST PARTUM TUBAL LIGATION;  Surgeon: Sherron Monday, MD;  Location: [facility];  Service: Gynecology;  Laterality: Bilateral;   Family History:       Family History  Problem Relation Age of Onset  . Arthritis Mother   . Diabetes Mother   . Migraines Brother   . Diabetes Maternal Aunt   . Mental retardation Maternal Aunt        schizophrenia  . Diabetes Maternal Uncle    Family Psychiatric  History: Maternal aunt - schizophrenia Multiple cousins smoke marijuana  Tobacco Screening: Have you used any form of tobacco in the last 30 days? (Cigarettes, Smokeless Tobacco, Cigars, and/or Pipes): No Social History:  Social History      Substance and Sexual Activity  Alcohol Use No     Social History      Substance and  Sexual Activity  Drug Use No    Additional Social History: Marital status: Married Number of Years Married: 2("We been together for 13 years and married for 2 years." ) Husband helped raise older daughters What types of issues is patient dealing with in the relationship?: No issues reported. Pt then stated "we are now because he has me in here and he will have to make it up to me."  Additional relationship information: N/A Are you sexually active?: Yes What is your sexual orientation?: Heterosexual  Has your sexual activity been affected by drugs, alcohol, medication, or emotional stress?: "No." Does patient have children?: Yes How many children?: 4 How is patient's relationship with their children?: "It is good, it is great."      Patient describes childhood as rowdy and got into many fights, mostly to defend herself. She had good grades. She obtained her associates degree in applied science from [employer] and is currently in school for a massage therapy license, expecting to graduate in Feb 2020. Children are 6 (M, autistic), 25 (M), 61 (F), and 44 (F). Grown daughters in college at [employer]  Previously had charges for assault and drugs as a teenager but has had no legal issues for the past 20 years.  Allergies:  No Known Allergies Lab Results:  LabResultsLast48Hours        Results for orders placed or performed during the hospital encounter of 06/07/18 (from the past 48 hour(s))  CBG monitoring, ED     Status: None   Collection Time: 06/07/18  7:37 PM  Result Value Ref Range   Glucose-Capillary 94 70 - 99 mg/dL   Comment 1 Notify RN    Comment 2 Document in Chart   Urine rapid drug screen (hosp performed)     Status: Abnormal   Collection Time: 06/07/18  9:22 PM  Result Value Ref Range   Opiates POSITIVE (A) NONE DETECTED   Cocaine POSITIVE (A) NONE DETECTED   Benzodiazepines POSITIVE (A) NONE DETECTED   Amphetamines NONE DETECTED NONE DETECTED   Tetrahydrocannabinol  POSITIVE (A) NONE DETECTED   Barbiturates NONE DETECTED NONE DETECTED    Comment: (NOTE) DRUG SCREEN FOR MEDICAL PURPOSES ONLY.  IF CONFIRMATION IS NEEDED FOR ANY PURPOSE, NOTIFY LAB WITHIN 5 DAYS. LOWEST DETECTABLE LIMITS FOR URINE DRUG SCREEN Drug Class                     Cutoff (ng/mL) Amphetamine and metabolites    1000 Barbiturate and metabolites    200 Benzodiazepine  200 Tricyclics and metabolites     300 Opiates and metabolites        300 Cocaine and metabolites        300 THC                            50 Performed at [facility], [address] [place], [place] [address]       Blood Alcohol level:  RecentLabs       Lab Results  Component Value Date   ETH <10 06/07/2018      Metabolic Disorder Labs:  RecentLabs  No results found for: HGBA1C, MPG   RecentLabs  No results found for: PROLACTIN   RecentLabs  No results found for: CHOL, TRIG, HDL, CHOLHDL, VLDL, LDLCALC    Current Medications:          Current Facility-Administered Medications  Medication Dose Route Frequency Provider Last Rate Last Dose  . acetaminophen (TYLENOL) tablet 650 mg  650 mg Oral Q6H PRN Jackelyn PolingBerry, Jason A, NP      . alum & mag hydroxide-simeth (MAALOX/MYLANTA) 200-200-20 MG/5ML suspension 30 mL  30 mL Oral Q4H PRN Nira ConnBerry, Jason A, NP      . chlordiazePOXIDE (LIBRIUM) capsule 25 mg  25 mg Oral Q6H PRN Nira ConnBerry, Jason A, NP      . hydrOXYzine (ATARAX/VISTARIL) tablet 25 mg  25 mg Oral TID PRN Nira ConnBerry, Jason A, NP      . loperamide (IMODIUM) capsule 2-4 mg  2-4 mg Oral PRN Nira ConnBerry, Jason A, NP      . magnesium hydroxide (MILK OF MAGNESIA) suspension 30 mL  30 mL Oral Daily PRN Nira ConnBerry, Jason A, NP      . multivitamin with minerals tablet 1 tablet  1 tablet Oral Daily Nira ConnBerry, Jason A, NP      . ondansetron (ZOFRAN-ODT) disintegrating tablet 4 mg  4 mg Oral Q6H PRN Nira ConnBerry, Jason A, NP      . thiamine (VITAMIN B-1) tablet 100 mg  100 mg Oral Daily Nira ConnBerry, Jason A,  NP      . traZODone (DESYREL) tablet 50 mg  50 mg Oral QHS PRN Jackelyn PolingBerry, Jason A, NP       PTA Medications:        Medications Prior to Admission  Medication Sig Dispense Refill Last Dose  . aspirin-acetaminophen-caffeine (EXCEDRIN MIGRAINE) 250-250-65 MG tablet Take 2 tablets by mouth daily as needed for headache.   06/02/2018    Musculoskeletal: Strength & Muscle Tone: within normal limits Gait & Station: normal Patient leans: N/A   Psychiatric Specialty Exam: Physical Exam  Nursing note and vitals reviewed. Constitutional: She is oriented to person, place, and time. She appears well-developed and well-nourished. No distress.  Tattoos on limbs  Respiratory: Effort normal.  Musculoskeletal: Normal range of motion.  Neurological: She is alert and oriented to person, place, and time.  Psychiatric: She has a normal mood and affect. Her behavior is normal.    Review of Systems  Psychiatric/Behavioral: Positive for depression and substance abuse. Negative for hallucinations, memory loss and suicidal ideas. The patient has insomnia. The patient is not nervous/anxious.     Blood pressure 103/75, pulse 93, temperature 98.4 F (36.9 C), temperature source Oral, resp. rate 18, height 5\' 7"  (1.702 m), weight 160 lb (72.6 kg), last menstrual period 05/17/2018, SpO2 100 %, unknown if currently breastfeeding.Body mass index is 25.06 kg/m.  General Appearance: Casual and Well Groomed  Eye Contact:  Good  Speech:  Normal Rate  Volume:  Normal  Mood:  Depressed, anxious  Affect:  Appropriate, Congruent, Depressed and Tearful  Thought Process:  Coherent, Goal Directed, Linear and Descriptions of Associations: Intact  Orientation:  Full (Time, Place, and Person)  Thought Content:  Logical, denies AVH  Suicidal Thoughts:  Denies  Homicidal Thoughts:  No  Memory:  Immediate;   Good Recent;   Good Remote;   Good  Judgement:  Poor  Insight:  Fair  Psychomotor Activity:  Normal   Concentration:  Concentration: Good and Attention Span: Good  Recall:  Good  Fund of Knowledge:  Good  Language:  Good  Akathisia:  No  AIMS (if indicated):     Assets:  Communication Skills Desire for Improvement Financial Resources/Insurance Housing Intimacy Leisure Time Physical Health Social Support Transportation Vocational/Educational  ADL's:  Intact  Cognition:  WNL  Sleep:  Number of Hours: 5.75   Treatment Plan Summary: Daily contact with patient to assess and evaluate symptoms and progress in treatment and Medication management  Observation Level/Precautions:  15 minute checks  Laboratory:  CBC Chemistry Profile UDS  Psychotherapy:  attend groups on unit  Medications:  offered medication for depression, patient declines  Consultations:  n/a  Discharge Concerns:  safety, substance use  Estimated LOS: 1-3 days  Other:  AA/NA support groups; outpatient psychiatrist and therapy   Physician Treatment Plan for Primary Diagnosis: Severe recurrent major depression without psychotic features (HCC) Long Term Goal(s): Improvement in symptoms so as ready for discharge  Short Term Goals: Ability to identify changes in lifestyle to reduce recurrence of condition will improve, Ability to demonstrate self-control will improve and Ability to identify triggers associated with substance abuse/mental health issues will improve  Physician Treatment Plan for Secondary Diagnosis: Principal Problem:   Severe recurrent major depression without psychotic features (HCC)  I certify that inpatient services furnished can reasonably be expected to improve the patient's condition.    Information obtained and reviewed with Kearney Hard, Medical Student 7/24/20193:54 PM    [name], MD 7/24/201911:40 AM

## 2018-06-10 ENCOUNTER — Encounter (HOSPITAL_COMMUNITY): Payer: Self-pay | Admitting: Behavioral Health

## 2018-06-10 DIAGNOSIS — F1994 Other psychoactive substance use, unspecified with psychoactive substance-induced mood disorder: Secondary | ICD-10-CM

## 2018-06-10 DIAGNOSIS — F332 Major depressive disorder, recurrent severe without psychotic features: Principal | ICD-10-CM

## 2018-06-10 NOTE — Progress Notes (Signed)
  [facility] Dis[facility] :  Will you be returning to the same living situation after discharge:  Yes,  home At discharge, do you have transportation home?: Yes,  husband coming after 1pm Do you have the ability to pay for your medications: Yes,  mental health  Release of information consent forms completed and submitted to medical records by CSW.  Patient to Follow up at: Follow-up Information    [facility] Follow up on 06/17/2018.   Specialty:  Behavioral Health Why:  Hospital follow-up appointment on Thursday, 06/17/18 at 8:15AM. Please bring: photo ID, social security card, any proof of household income, and hospital discharge paperwork to this appt. Thank you.  Contact information: [address] [place] [address] [phone]           Next level of care provider has access to [facility] Link:no  Safety Planning and Suicide Prevention discussed: Yes,  SPE completed with pt and her husband. SPI pamphlet and Mobile Crisis information provided.   Have you used any form of tobacco in the last 30 days? (Cigarettes, Smokeless Tobacco, Cigars, and/or Pipes): No  Has patient been referred to the Quitline?: N/A patient is not a smoker  Patient has been referred for addiction treatment: Yes  Rona RavensHeather S Melony Tenpas, LCSW 06/10/2018, 8:26 AM

## 2018-06-10 NOTE — Progress Notes (Signed)
Patient was anxious and assertive upon approach. Patient refused to take medications again stating "I'm not taking any of that, I don't need it." Writer said "ok" and walked out of the day room. Patient was irritable and clearly ready to go. Discharge plans are in place. Denied SI HI AVH. Safety maintained with 15 minute checks.

## 2018-06-10 NOTE — Progress Notes (Signed)
Discharge note: Patient reviewed discharge paperwork with RN including prescriptions, follow up appointments, and lab work. Patient given the opportunity to ask questions. All concerns were addressed. All belongings were returned to patient. Denied SI/HI/AVH. Patient thanked staff for their care while at the hospital. Patient was discharged to lobby where husband was waiting to pick her up.   

## 2018-06-10 NOTE — BHH Suicide Risk Assessment (Signed)
[facility] Discharge Suicide Risk Assessment   Principal Problem: Substance induced mood disorder [facility]) Discharge Diagnoses:  Patient Active Problem List   Diagnosis Date Noted  . Severe recurrent major depression without psychotic features (HCC) [F33.2] 06/08/2018    Total Time spent with patient: 45 minutes  History of Present Illness: Stacey Sparks is a 37 year old black female with a history of MDD and substance use disorder who presents under IVC from the ER for suicidal ideation after overdose on Benadryl and muscle relaxants. Patient was found by husband on Monday after having taken 20 OTC Benadryl and 10 muscle relaxants (obtained from friend), who then called 911 out of concern for overdose.   On evaluation, patient denies SI, HI, AVH.  She is calm and cooperative and does not appear paranoid or to be responding to internal stimuli.  She endorses "using uppers and downers to get trough the day, which she buys "on the street."' She states she "can not use substances if she chooses not to."  She is agreeable to counseling for stress management and substance abuse support groups. Stacey Sparks has been provided apportionments and resources for follow-up. She was able to engage in safety planning including plan to return to [facility] or contact emergency services if she feels unable to maintain her own safety or the safety of others. Pt had no further questions, comments, or concerns.   Musculoskeletal: Strength & Muscle Tone: within normal limits Gait & Station: normal Patient leans: N/A  Psychiatric Specialty Exam: Review of Systems  Constitutional: Negative.   Cardiovascular: Negative.   Gastrointestinal: Negative.   Neurological: Negative.   Psychiatric/Behavioral: Positive for substance abuse. Negative for depression, hallucinations, memory loss and suicidal ideas. The patient is not nervous/anxious and does not have insomnia.   Nursing notes and VS reviewed  Blood pressure 104/86,  pulse 78, temperature 98.3 F (36.8 C), temperature source Oral, resp. rate 16, height 5\' 7"  (1.702 m), weight 72.6 kg (160 lb), last menstrual period 05/17/2018, SpO2 100 %, unknown if currently breastfeeding.Body mass index is 25.06 kg/m.  General Appearance: Casual and Well Groomed  Eye Contact::  Good  Speech:  Clear and Coherent and Normal Rate409  Volume:  Normal  Mood:  "good"  Affect:  Appropriate, Congruent and Full Range  Thought Process:  Coherent, Goal Directed, Linear and Descriptions of Associations: Intact  Orientation:  Full (Time, Place, and Person)  Thought Content:  Logical and Hallucinations: None  Suicidal Thoughts:  No  Homicidal Thoughts:  No  Memory:  Immediate;   Good Recent;   Good Remote;   Good  Judgement:  Fair  Insight:  Fair  Psychomotor Activity:  Normal  Concentration:  Good  Recall:  Good  Fund of Knowledge:Good  Language: Good  Akathisia:  No  AIMS (if indicated):   n/a  Assets:  Communication Skills Desire for Improvement Financial Resources/Insurance Housing Intimacy Physical Health Social Support Talents/Skills  Sleep:  Number of Hours: 5.75  Cognition: WNL  ADL's:  Intact   Mental Status Per Nursing Assessment::   On Admission:  Suicidal ideation indicated by others  Demographic Factors:  In school; 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  Patients presenting with no risk factors but with morbid ruminations; may be classified as minimal risk based on the severity of the  depressive symptoms  Follow-up Information    [facility] Follow up on 06/17/2018.   Specialty:  Behavioral Health Why:  Hospital follow-up appointment on Thursday, 06/17/18 at 8:15AM. Please bring: photo ID, social security card, any proof of household income, and hospital discharge paperwork to this appt. Thank you.  Contact information: [address] [place] [address] [phone]           Plan Of Care/Follow-up recommendations:  Activity:  as tolerated Diet:  as tolerated   On 06/10/18 following sustained improvement in the affect of this patient, continued report of euthymic mood, repeated denial of suicidal, homicidal, and other violent ideation, adequate interaction with peers, active participation in groups while on the unit, and collateral information from family denying safety concerns, the treatment team decided [name] was stable for discharge home with scheduled mental health treatment as noted below.  Patient declined medications for mood disorder.  She is agreeable to counseling for stress management and substance abuse support groups. [name] has been provided apportionments and resources for follow-up. She was able to engage in safety planning including plan [facility] or contact emergency services if she feels unable to maintain her own safety or the safety of others.     Mariel CraftSHEILA M Hrishikesh Hoeg, MD 06/10/2018, 10:30 AM

## 2018-06-10 NOTE — Plan of Care (Signed)
  Problem: Education: Goal: Knowledge of [facility] Education information/materials will improve Outcome: Adequate for Discharge Goal: Emotional status will improve Outcome: Adequate for Discharge Goal: Mental status will improve Outcome: Adequate for Discharge Goal: Verbalization of understanding the information provided will improve Outcome: Adequate for Discharge   Problem: Activity: Goal: Interest or engagement in activities will improve Outcome: Adequate for Discharge Goal: Sleeping patterns will improve Outcome: Adequate for Discharge   Problem: Coping: Goal: Ability to verbalize frustrations and anger appropriately will improve Outcome: Adequate for Discharge Goal: Ability to demonstrate self-control will improve Outcome: Adequate for Discharge   Problem: Health Behavior/Discharge Planning: Goal: Identification of resources available to assist in meeting health care needs will improve Outcome: Adequate for Discharge Goal: Compliance with treatment plan for underlying cause of condition will improve Outcome: Adequate for Discharge   Problem: Physical Regulation: Goal: Ability to maintain clinical measurements within normal limits will improve Outcome: Adequate for Discharge   Problem: Safety: Goal: Periods of time without injury will increase Outcome: Adequate for Discharge   Problem: Education: Goal: Ability to make informed decisions regarding treatment will improve Outcome: Adequate for Discharge   Problem: Coping: Goal: Coping ability will improve Outcome: Adequate for Discharge   Problem: Health Behavior/Discharge Planning: Goal: Identification of resources available to assist in meeting health care needs will improve Outcome: Adequate for Discharge   Problem: Medication: Goal: Compliance with prescribed medication regimen will improve Outcome: Adequate for Discharge   Problem: Self-Concept: Goal: Ability to disclose and discuss suicidal ideas  will improve Outcome: Adequate for Discharge Goal: Will verbalize positive feelings about self Outcome: Adequate for Discharge   Problem: Education: Goal: Utilization of techniques to improve thought processes will improve Outcome: Adequate for Discharge Goal: Knowledge of the prescribed therapeutic regimen will improve Outcome: Adequate for Discharge   Problem: Activity: Goal: Interest or engagement in leisure activities will improve Outcome: Adequate for Discharge Goal: Imbalance in normal sleep/wake cycle will improve Outcome: Adequate for Discharge   Problem: Coping: Goal: Coping ability will improve Outcome: Adequate for Discharge Goal: Will verbalize feelings Outcome: Adequate for Discharge   Problem: Health Behavior/Discharge Planning: Goal: Ability to make decisions will improve Outcome: Adequate for Discharge Goal: Compliance with therapeutic regimen will improve Outcome: Adequate for Discharge   Problem: Role Relationship: Goal: Will demonstrate positive changes in social behaviors and relationships Outcome: Adequate for Discharge   Problem: Safety: Goal: Ability to disclose and discuss suicidal ideas will improve Outcome: Adequate for Discharge Goal: Ability to identify and utilize support systems that promote safety will improve Outcome: Adequate for Discharge   Problem: Self-Concept: Goal: Will verbalize positive feelings about self Outcome: Adequate for Discharge Goal: Level of anxiety will decrease Outcome: Adequate for Discharge   Problem: Education: Goal: Knowledge of disease or condition will improve Outcome: Adequate for Discharge   Problem: Health Behavior/Discharge Planning: Goal: Ability to identify changes in lifestyle to reduce recurrence of condition will improve Outcome: Adequate for Discharge

## 2018-06-10 NOTE — Discharge Summary (Addendum)
Physician Discharge Summary Note  Patient:  Stacey Sparks is an 37 y.o., female MRN:  [id number] DOB:  [birth year] Patient phone:  [phone] (home)  Patient address:   [address] [place] [address],  Total Time spent with patient: 30 minutes  Date of Admission:  06/08/2018 Date of Discharge: 06/10/2018  Reason for Admission:  questionable overdose   Principal Problem: Severe recurrent major depression without psychotic features [facility]) Discharge Diagnoses: Patient Active Problem List   Diagnosis Date Noted  . Severe recurrent major depression without psychotic features ([place]) [F33.2] 06/08/2018    Past Psychiatric History: severe recurrent MDD without psychotic features; substance use disorder; Suicide attempt by overdose at 11 years    Past Medical History:  Past Medical History:  Diagnosis Date  . Abnormal Pap smear   . Headache(784.0)    migraine  . Ovarian cyst   . Preeclampsia postpartum  . S/P tubal ligation 01/24/2012    Past Surgical History:  Procedure Laterality Date  . FEMUR SURGERY  1996  . TUBAL LIGATION  01/24/2012   Procedure: POST PARTUM TUBAL LIGATION;  Surgeon: Thornell Sartorius, MD;  Location: [facility];  Service: Gynecology;  Laterality: Bilateral;   Family History:  Family History  Problem Relation Age of Onset  . Arthritis Mother   . Diabetes Mother   . Migraines Brother   . Diabetes Maternal Aunt   . Mental retardation Maternal Aunt        schizophrenia  . Diabetes Maternal Uncle    Family Psychiatric  History: Maternal aunt - schizophrenia Multiple cousins smoke marijuana   Social History:  Social History   Substance and Sexual Activity  Alcohol Use No     Social History   Substance and Sexual Activity  Drug Use No    Social History   Socioeconomic History  . Marital status: Married    Spouse name: Not on file  . Number of children: Not on file  . Years of education: Not on file  . Highest education level: Not on  file  Occupational History  . Not on file  Social Needs  . Financial resource strain: Not on file  . Food insecurity:    Worry: Not on file    Inability: Not on file  . Transportation needs:    Medical: Not on file    Non-medical: Not on file  Tobacco Use  . Smoking status: Never Smoker  . Smokeless tobacco: Never Used  Substance and Sexual Activity  . Alcohol use: No  . Drug use: No  . Sexual activity: Not on file  Lifestyle  . Physical activity:    Days per week: Not on file    Minutes per session: Not on file  . Stress: Not on file  Relationships  . Social connections:    Talks on phone: Not on file    Gets together: Not on file    Attends religious service: Not on file    Active member of club or organization: Not on file    Attends meetings of clubs or organizations: Not on file    Relationship status: Not on file  Other Topics Concern  . Not on file  Social History Narrative  . Not on file    Hospital Course:  HPI from triage assessment:  Stacey L Fletcheris a 37 y.o.femalewho came to [facility] following suicide attempt by overdosing on medication. Pt shared that she took approximately 15 Benadryl pills and approximately 5 muscle relaxers due to getting  depressed. Pt verified to clinician that her goal for taking this medication was to kill herself. Pt states that she did the same thing when she was in middle school; she states that she went to the hospital, had her stomach pumped, and they sent her home (no follow-up with behavioral health). Pt expressed she would like to go home now and that she now knows better. Pt is now denying SI. She also denies HI, NSSIB, and AVH. Pt states she has never worked with a Transport planner nor with a psychiatrist. She denies any access to weapons, and her husband verified this, and she denies any involvement with the court system. Pt shares she smokes two joints of marijuana on a daily basis and that she last smoked today. She shares she drinks  alcohol approximately 2x / week and that, when she drinks, she drinks "a lot." Pt estimates she drinks approximately a pink of liquor when she drinks. She states the last time she drank was Saturday, June 05, 2018.  Following triage assessment, patient assessed by attending psychiatrist. On evaluation, patient reported that she took the drugs over a period of 3-4 hours to help her sleep due to recent stress from her school work and denied any suicidal ideation or homicidal ideation. She endorsed "using uppers and downers to get trough the day, and buys them from dealers on the street."' She does not remember saying that she was trying to kill herself. She denied any AVH, symptoms of mania, anxiety, OCD, or PTSD.  Collateral from husband confirmed that he initially called 47 for concern of overdose but not suicidal ideation or other psychiatric illness. Husband stated the situation had gotten out of hand and wants patient to return home as soon as possible.  Patient was monitored for 24 hours while on the unit. Patient was enrolled & participated in the group counseling sessions being offerred & held on this unit. Patient learned coping skills.She was not started on medication during her hospital course. Prior to discharge, family meeting held with husband who reports he is collecting medication in house so that she can not have access. She is agreeable to counseling for stress management and substance abuse support groups.   Labs: Reviewed and noted as follow; TSH normal, UDS positive for  Opiates, cocaine, benzodiazepines, THC.   Carlye is seen today by the attending psychiatrist for discharge. Patient denies any delusions, no hallucinations or other psychotic process. Patient denies active or passive suicidal thoughts. No thoughts of violence. No craving for drugs or withdrawal symtpoms.    Nursing staff reports that patient has been appropriate on the unit. Patient has been interacting well with peers.  No behavioral issues. Patient has not voiced any suicidal thoughts. Prior to discharge, patients case was discussed at the treatment team meeting. Team members feels that patient is back to baseline level of functioning. Team agrees with plan to discharge patient today. Patient was provided with all follow-up information to resume mental health treatment following discharge as noted below.  Patient left [facility] with all personal belongings in no apparent distress. Transportation per patient/ family arrangement.    Physical Findings: AIMS:  , ,  ,  ,    CIWA:  CIWA-Ar Total: 1 COWS:     Musculoskeletal: Strength & Muscle Tone: within normal limits Gait & Station: normal Patient leans: N/A  Psychiatric Specialty Exam: SEE SRA BY MD  Physical Exam  Nursing note and vitals reviewed. Constitutional: She is oriented to person, place, and time.  Neurological: She is alert and oriented to person, place, and time.    Review of Systems  Psychiatric/Behavioral: Negative for depression, hallucinations, memory loss, substance abuse and suicidal ideas. The patient is not nervous/anxious and does not have insomnia.   All other systems reviewed and are negative.   Blood pressure 104/86, pulse 78, temperature 98.3 F (36.8 C), temperature source Oral, resp. rate 16, height 5' 7"  (1.702 m), weight 72.6 kg (160 lb), last menstrual period 05/17/2018, SpO2 100 %, unknown if currently breastfeeding.Body mass index is 25.06 kg/m.   Have you used any form of tobacco in the last 30 days? (Cigarettes, Smokeless Tobacco, Cigars, and/or Pipes): No  Has this patient used any form of tobacco in the last 30 days? (Cigarettes, Smokeless Tobacco, Cigars, and/or Pipes)  N/A  Blood Alcohol level:  Lab Results  Component Value Date   ETH <10 71/85/5015    Metabolic Disorder Labs:  No results found for: HGBA1C, MPG No results found for: PROLACTIN No results found for: CHOL, TRIG, HDL, CHOLHDL, VLDL, LDLCALC  See  Psychiatric Specialty Exam and Suicide Risk Assessment completed by Attending Physician prior to discharge.  Discharge destination:  Home  Is patient on multiple antipsychotic therapies at discharge:  No   Has Patient had three or more failed trials of antipsychotic monotherapy by history:  No  Recommended Plan for Multiple Antipsychotic Therapies: NA   Allergies as of 06/10/2018   No Known Allergies     Medication List    STOP taking these medications   aspirin-acetaminophen-caffeine 868-257-49 MG tablet Commonly known as:  EXCEDRIN MIGRAINE      Follow-up Information    [facility] Follow up on 06/17/2018.   Specialty:  Behavioral Health Why:  Hospital follow-up appointment on Thursday, 06/17/18 at 8:15AM. Please bring: photo ID, social security card, any proof of household income, and hospital discharge paperwork to this appt. Thank you.  Contact information: [place] [address] [phone]           Follow-up recommendations:  Follow up with your outpatient provided for any medical issues. Activity & diet as recommended by your primary care provider.  Comments:  Patient is instructed prior to discharge to: Take all medications as prescribed by his/her mental healthcare provider. Report any adverse effects and or reactions from the medicines to his/her outpatient provider promptly. Patient has been instructed & cautioned: To not engage in alcohol and or illegal drug use while on prescription medicines. In the event of worsening symptoms, patient is instructed to call the crisis hotline, 911 and or go to the nearest ED for appropriate evaluation and treatment of symptoms. To follow-up with his/her primary care provider for your other medical issues, concerns and or health care needs.  Signed: Mordecai Maes, NP 06/10/2018, 10:24 AM   I have reviewed NP's Note, assessement, diagnosis and plan, and agree. I have also met with patient and completed suicide  risk assessment.  Lavella Hammock, MD

## 2019-07-27 ENCOUNTER — Encounter (HOSPITAL_COMMUNITY): Payer: Self-pay | Admitting: Family Medicine

## 2019-07-27 ENCOUNTER — Emergency Department (HOSPITAL_COMMUNITY)
Admission: EM | Admit: 2019-07-27 | Discharge: 2019-07-27 | Disposition: A | Discharge status: Home / Self Care | Payer: PRIVATE HEALTH INSURANCE | Attending: Emergency Medicine | Admitting: Emergency Medicine

## 2019-07-27 ENCOUNTER — Other Ambulatory Visit: Payer: Self-pay

## 2019-07-27 DIAGNOSIS — R197 Diarrhea, unspecified: Secondary | ICD-10-CM | POA: Diagnosis not present

## 2019-07-27 DIAGNOSIS — R1013 Epigastric pain: Secondary | ICD-10-CM | POA: Insufficient documentation

## 2019-07-27 DIAGNOSIS — F17210 Nicotine dependence, cigarettes, uncomplicated: Secondary | ICD-10-CM | POA: Insufficient documentation

## 2019-07-27 DIAGNOSIS — R1010 Upper abdominal pain, unspecified: Secondary | ICD-10-CM

## 2019-07-27 LAB — URINALYSIS, ROUTINE W REFLEX MICROSCOPIC
Bilirubin Urine: NEGATIVE
Glucose, UA: NEGATIVE mg/dL
Hgb urine dipstick: NEGATIVE
Ketones, ur: NEGATIVE mg/dL
Leukocytes,Ua: NEGATIVE
Nitrite: NEGATIVE
Protein, ur: NEGATIVE mg/dL
Specific Gravity, Urine: 1.006 (ref 1.005–1.030)
pH: 5 (ref 5.0–8.0)

## 2019-07-27 LAB — COMPREHENSIVE METABOLIC PANEL
ALT: 17 U/L (ref 0–44)
AST: 18 U/L (ref 15–41)
Albumin: 4 g/dL (ref 3.5–5.0)
Alkaline Phosphatase: 46 U/L (ref 38–126)
Anion gap: 5 (ref 5–15)
BUN: 12 mg/dL (ref 6–20)
CO2: 25 mmol/L (ref 22–32)
Calcium: 9.8 mg/dL (ref 8.9–10.3)
Chloride: 106 mmol/L (ref 98–111)
Creatinine, Ser: 0.86 mg/dL (ref 0.44–1.00)
GFR calc Af Amer: >60 mL/min (ref >60)
GFR calc non Af Amer: >60 mL/min (ref >60)
Glucose, Bld: 105 mg/dL — ABNORMAL HIGH (ref 70–99)
Potassium: 3.8 mmol/L (ref 3.5–5.1)
Sodium: 136 mmol/L (ref 135–145)
Total Bilirubin: 0.7 mg/dL (ref 0.3–1.2)
Total Protein: 7.4 g/dL (ref 6.5–8.1)

## 2019-07-27 LAB — CBC
HCT: 35.1 % — ABNORMAL LOW (ref 36.0–46.0)
Hemoglobin: 11.8 g/dL — ABNORMAL LOW (ref 12.0–15.0)
MCH: 35.9 pg — ABNORMAL HIGH (ref 26.0–34.0)
MCHC: 33.6 g/dL (ref 30.0–36.0)
MCV: 106.7 fL — ABNORMAL HIGH (ref 80.0–100.0)
Platelets: 277 10*3/uL (ref 150–400)
RBC: 3.29 MIL/uL — ABNORMAL LOW (ref 3.87–5.11)
RDW: 13 % (ref 11.5–15.5)
WBC: 7.5 10*3/uL (ref 4.0–10.5)
nRBC: 0 % (ref 0.0–0.2)

## 2019-07-27 LAB — PREGNANCY, URINE: Preg Test, Ur: NEGATIVE

## 2019-07-27 LAB — LIPASE, BLOOD: Lipase: 35 U/L (ref 11–51)

## 2019-07-27 MED ORDER — ALUM & MAG HYDROXIDE-SIMETH 200-200-20 MG/5ML PO SUSP
30.0000 mL | Freq: Once | ORAL | Status: AC
  Administered 2019-07-27: 08:00:00 30 mL via ORAL
  Filled 2019-07-27: qty 30

## 2019-07-27 MED ORDER — SODIUM CHLORIDE 0.9% FLUSH: 3.0000 mL | Freq: Once | INTRAVENOUS | Status: DC

## 2019-07-27 MED ORDER — LIDOCAINE VISCOUS HCL 2 % MT SOLN
15.0000 mL | Freq: Once | OROMUCOSAL | Status: AC
  Administered 2019-07-27: 15 mL via ORAL
  Filled 2019-07-27: qty 15

## 2019-07-27 NOTE — ED Notes (Signed)
Urine culture sent with specimen 

## 2019-07-27 NOTE — Discharge Instructions (Addendum)
Take pepcid daily.  Avoid antiinflammatories such as ibuprofen, advil, aleve. Take tylenol as needed for pain.  Avoid spicy, greasy, acidic, fatty foods.  There is information of this in the paperwork.  After eating dinner, make sure you stay upright for at least 60 minutes. Follow-up with your primary care doctor.  You may call the number the paperwork to help set up primary care.  There is also information for the GI doctor if you need further evaluation. Return to the emergency room if you develop fevers, persistent vomiting, your vomiting blood, severe worsening pain, or any new, worsening, or concerning symptoms.

## 2019-07-27 NOTE — ED Triage Notes (Signed)
Patient is complaining of upper gastric pain that started around 19:30 last night. Associated symptoms of diarrhea earlier today but denies her abd was not hurting then. Took Tums and Pepcid with no relief.

## 2019-07-27 NOTE — ED Provider Notes (Signed)
[facility] DEPT Provider Note   CSN: [id number] Arrival date & time: 07/27/19  0932     History   Chief Complaint Chief Complaint  Patient presents with   Abdominal Pain    HPI Stacey Sparks is a 38 y.o. female presenting for evaluation of abdominal pain.  Patient states around 730 last night she started to develop epigastric abdominal pain.  Is described as a stabbing/squeezing/tightness.  She feels like something is stuck there.  It began after drinking sugar-free lemonade.  She did not have any dinner last night.  She reports one episode of loose stools yesterday as well as the day before.  Symptoms are worse when she lays down.  She tried Tums and Pepcid without improvement of symptoms.  She denies fevers, chills, nausea, vomiting, low abdominal pain, urinary symptoms, abnormal bowel movements.  Patient states of the past year, she has been having reflux symptoms at least 3-4 times a month.  Prior to this year, she was never having issues with reflux.  He felt symptoms are usually well controlled with Tums.  She is not on anything chronically.  She has not seen any provider about this before.  Patient states she is no other medical problems, takes no medications daily.  She smokes cigarettes daily, uses alcohol occasionally, though none recently.  Denies drug use.     HPI  Past Medical History:  Diagnosis Date   Abnormal Pap smear    Headache(784.0)    migraine   Ovarian cyst    Preeclampsia postpartum   S/P tubal ligation 01/24/2012    Patient Active Problem List   Diagnosis Date Noted   Substance induced mood disorder ([place]) 06/10/2018   Severe recurrent major depression without psychotic features ([place]) 06/08/2018    Past Surgical History:  Procedure Laterality Date   FEMUR SURGERY  1996   TUBAL LIGATION  01/24/2012   Procedure: POST PARTUM TUBAL LIGATION;  Surgeon: Thornell Sartorius, MD;  Location: [facility];  Service: Gynecology;   Laterality: Bilateral;     OB History    Gravida  6   Para  4   Term  4   Preterm  0   AB  2   Living  4     SAB  1   TAB  1   Ectopic  0   Multiple  0   Live Births  4            Home Medications    Prior to Admission medications   Medication Sig Start Date End Date Taking? Authorizing Provider  calcium carbonate (TUMS - DOSED IN MG ELEMENTAL CALCIUM) 500 MG chewable tablet Chew 1 tablet by mouth as needed for indigestion or heartburn.   Yes [provider]  Famotidine (PEPCID AC PO) Take 1 tablet by mouth daily as needed (indigestion/heartburn).    Yes [provider]    Family History Family History  Problem Relation Age of Onset   Arthritis Mother    Diabetes Mother    Migraines Brother    Diabetes Maternal Aunt    Mental retardation Maternal Aunt        schizophrenia   Diabetes Maternal Uncle     Social History Social History   Tobacco Use   Smoking status: Current Every Day Smoker    Packs/day: 0.50   Smokeless tobacco: Never Used  Substance Use Topics   Alcohol use: No   Drug use: No     Allergies  Patient has no known allergies.   Review of Systems Review of Systems  Gastrointestinal: Positive for abdominal pain.  All other systems reviewed and are negative.    Physical Exam Updated Vital Signs BP (!) 92/53 (BP Location: Right Arm)    Pulse (!) 56    Temp 98.3 F (36.8 C) (Oral)    Resp 17    Ht 5\' 7"  (1.702 m)    Wt 74.8 kg    LMP 07/20/2019    SpO2 100%    BMI 25.84 kg/m   Physical Exam Vitals signs and nursing note reviewed.  Constitutional:      General: She is not in acute distress.    Appearance: She is well-developed.     Comments: Obese female laying in the bed in no acute distress  HENT:     Head: Normocephalic and atraumatic.  Eyes:     Conjunctiva/sclera: Conjunctivae normal.     Pupils: Pupils are equal, round, and reactive to light.  Neck:     Musculoskeletal: Normal range  of motion and neck supple.  Cardiovascular:     Rate and Rhythm: Normal rate and regular rhythm.     Pulses: Normal pulses.  Pulmonary:     Effort: Pulmonary effort is normal. No respiratory distress.     Breath sounds: Normal breath sounds. No wheezing.  Abdominal:     General: There is no distension.     Palpations: Abdomen is soft.     Tenderness: There is abdominal tenderness in the epigastric area.     Comments: Tenderness palpation of epigastric abdomen.  Abdomen is soft without rigidity, guarding, distention.  Negative rebound.  No signs peritonitis.  Negative Murphy's.  Musculoskeletal: Normal range of motion.  Skin:    General: Skin is warm and dry.     Capillary Refill: Capillary refill takes less than 2 seconds.  Neurological:     Mental Status: She is alert and oriented to person, place, and time.      ED Treatments / Results  Labs (all labs ordered are listed, but only abnormal results are displayed) Labs Reviewed  COMPREHENSIVE METABOLIC PANEL - Abnormal; Notable for the following components:      Result Value   Glucose, Bld 105 (*)    All other components within normal limits  CBC - Abnormal; Notable for the following components:   RBC 3.29 (*)    Hemoglobin 11.8 (*)    HCT 35.1 (*)    MCV 106.7 (*)    MCH 35.9 (*)    All other components within normal limits  URINALYSIS, ROUTINE W REFLEX MICROSCOPIC - Abnormal; Notable for the following components:   Color, Urine STRAW (*)    All other components within normal limits  LIPASE, BLOOD  PREGNANCY, URINE    EKG None  Radiology No results found.  Procedures Procedures (including critical care time)  Medications Ordered in ED Medications  sodium chloride flush (NS) 0.9 % injection 3 mL (has no administration in time range)  alum & mag hydroxide-simeth (MAALOX/MYLANTA) 200-200-20 MG/5ML suspension 30 mL (30 mLs Oral Given 07/27/19 0816)    And  lidocaine (XYLOCAINE) 2 % viscous mouth solution 15 mL (15  mLs Oral Given 07/27/19 0816)     Initial Impression / Assessment and Plan / ED Course  I have reviewed the triage vital signs and the nursing notes.  Pertinent labs & imaging results that were available during my care of the patient were reviewed by me and considered  in my medical decision making (see chart for details).        Patient presenting for evaluation of abdominal pain.  Physical exam reassuring, she appears nontoxic.  Pain is all epigastric, no lower abdominal pain.  Negative Murphy's.  No fevers, chills, nausea, vomiting.  As such, lower suspicion for intra-abdominal infection.  Will obtain abdominal labs and reassess.  Consider GERD versus PUD versus pancreatitis versus gallbladder etiology.  Doubt stricture or food bolus, as this began after patient drank fluids.  Labs reassuring, no leukocytosis.  Kidney, liver, pancreatic function reassuring.  Hemoglobin slightly low in the high elevens, but this appears baseline.  Will trial GI cocktail and PO challenge. If pain improves, likely GERD and pt can be d/c with pepcid and given info for f/u.   On reassessment, patient reports pain is resolved.  She is tolerating p.o. without difficulty.  As such, low suspicion for intra-abdominal infection, perforation, obstruction, surgical abdomen.  Do not believe she needs CT or further imaging at this time.  Discussed likelihood of reflux, and symptomatic treatment at home.  Discussed importance of follow-up with primary care.  Patient also given information for GI as needed.  At this time, patient appears safe for discharge.  Precautions given.  Patient states she understands and agrees to plan.  Final Clinical Impressions(s) / ED Diagnoses   Final diagnoses:  Upper abdominal pain    ED Discharge Orders    None       Alveria ApleyCaccavale, Dolphus Linch, PA-C 07/27/19 0941    Mancel BaleWentz, Elliott, MD 07/27/19 [id number]

## 2020-12-10 ENCOUNTER — Other Ambulatory Visit: Payer: PRIVATE HEALTH INSURANCE

## 2022-12-02 ENCOUNTER — Ambulatory Visit (INDEPENDENT_AMBULATORY_CARE_PROVIDER_SITE_OTHER): Payer: Commercial Managed Care - PPO | Admitting: Family Medicine

## 2022-12-02 VITALS — BP 103/69 | HR 69 | Temp 98.1°F | Resp 16 | Ht 67.0 in | Wt 179.4 lb

## 2022-12-02 DIAGNOSIS — M25542 Pain in joints of left hand: Secondary | ICD-10-CM | POA: Diagnosis not present

## 2022-12-02 DIAGNOSIS — M25552 Pain in left hip: Secondary | ICD-10-CM

## 2022-12-02 DIAGNOSIS — N924 Excessive bleeding in the premenopausal period: Secondary | ICD-10-CM

## 2022-12-02 DIAGNOSIS — Z7689 Persons encountering health services in other specified circumstances: Secondary | ICD-10-CM

## 2022-12-02 NOTE — Progress Notes (Signed)
Patient is here to established care with provider today. Patient has many health concern they would like to discuss with provider today  Care gaps discuss at appointment today  

## 2022-12-02 NOTE — Progress Notes (Addendum)
New Patient Office Visit  Subjective    Patient ID: Stacey Sparks, female    DOB: 05-21-1981  Age: 42 y.o. MRN: [id number]  CC:  Chief Complaint  Patient presents with   Establish Care    HPI Stacey Sparks presents to establish care and for complaint of left hip pain. Patient denies known trauma or injury. Also patient has pain in her left hand that may be 2/2 her job. Patient also reports menorrhagia.    Outpatient Encounter Medications as of 12/02/2022  Medication Sig   [DISCONTINUED] calcium carbonate (TUMS - DOSED IN MG ELEMENTAL CALCIUM) 500 MG chewable tablet Chew 1 tablet by mouth as needed for indigestion or heartburn.   [DISCONTINUED] Famotidine (PEPCID AC PO) Take 1 tablet by mouth daily as needed (indigestion/heartburn).    No facility-administered encounter medications on file as of 12/02/2022.    Past Medical History:  Diagnosis Date   Abnormal Pap smear    Headache(784.0)    migraine   Ovarian cyst    Preeclampsia postpartum   S/P tubal ligation 01/24/2012    Past Surgical History:  Procedure Laterality Date   FEMUR SURGERY  1996   TUBAL LIGATION  01/24/2012   Procedure: POST PARTUM TUBAL LIGATION;  Surgeon: Thornell Sartorius, MD;  Location: [facility];  Service: Gynecology;  Laterality: Bilateral;    Family History  Problem Relation Age of Onset   Arthritis Mother    Diabetes Mother    Migraines Brother    Diabetes Maternal Aunt    Mental retardation Maternal Aunt        schizophrenia   Diabetes Maternal Uncle     Social History   Socioeconomic History   Marital status: Single    Spouse name: Not on file   Number of children: Not on file   Years of education: Not on file   Highest education level: Not on file  Occupational History   Not on file  Tobacco Use   Smoking status: Every Day    Packs/day: 0.50    Types: Cigarettes   Smokeless tobacco: Never  Vaping Use   Vaping Use: Never used  Substance and Sexual Activity   Alcohol  use: No   Drug use: No   Sexual activity: Not on file  Other Topics Concern   Not on file  Social History Narrative   Not on file   Social Determinants of Health   Financial Resource Strain: Not on file  Food Insecurity: Not on file  Transportation Needs: Not on file  Physical Activity: Not on file  Stress: Not on file  Social Connections: Not on file  Intimate Partner Violence: Not on file    Review of Systems  All other systems reviewed and are negative.       Objective    BP 103/69   Pulse 69   Temp 98.1 F (36.7 C) (Oral)   Resp 16   Ht 5\' 7"  (1.702 m)   Wt 179 lb 6.4 oz (81.4 kg)   SpO2 97%   BMI 28.10 kg/m   Physical Exam Vitals and nursing note reviewed.  Constitutional:      General: She is not in acute distress. Cardiovascular:     Rate and Rhythm: Normal rate and regular rhythm.  Pulmonary:     Effort: Pulmonary effort is normal.     Breath sounds: Normal breath sounds.  Abdominal:     Palpations: Abdomen is soft.     Tenderness: There is  no abdominal tenderness.  Musculoskeletal:     Left hand: Tenderness present. No deformity. Decreased range of motion (left thumb).     Left hip: Tenderness present. Decreased range of motion.  Neurological:     General: No focal deficit present.     Mental Status: She is alert and oriented to person, place, and time.         Assessment & Plan:   1. Excessive bleeding in premenopausal period Referral to gyn for further eval/mgt - Ambulatory referral to Gynecology  2. Left hip pain Referral to ortho for further eval/mgt - Ambulatory referral to Orthopedic Surgery  3. Pain in thumb joint with movement, left Referral to ortho for further eval/mgt - Ambulatory referral to Orthopedic Surgery  4. Encounter to establish care     Return in about 3 months (around 03/03/2023) for physical.   Becky Sax, MD

## 2022-12-04 ENCOUNTER — Encounter: Payer: Self-pay | Admitting: Family Medicine

## 2022-12-11 ENCOUNTER — Ambulatory Visit: Payer: Commercial Managed Care - PPO | Admitting: Orthopaedic Surgery

## 2022-12-11 ENCOUNTER — Ambulatory Visit (INDEPENDENT_AMBULATORY_CARE_PROVIDER_SITE_OTHER): Payer: Commercial Managed Care - PPO

## 2022-12-11 DIAGNOSIS — M25552 Pain in left hip: Secondary | ICD-10-CM

## 2022-12-11 DIAGNOSIS — M79645 Pain in left finger(s): Secondary | ICD-10-CM

## 2022-12-11 MED ORDER — METHYLPREDNISOLONE ACETATE 40 MG/ML IJ SUSP
13.3300 mg | INTRAMUSCULAR | Status: AC | PRN
  Administered 2022-12-11: 13.33 mg

## 2022-12-11 MED ORDER — BUPIVACAINE HCL 0.5 % IJ SOLN
0.3300 mL | INTRAMUSCULAR | Status: AC | PRN
  Administered 2022-12-11: .33 mL

## 2022-12-11 MED ORDER — BUPIVACAINE HCL 0.5 % IJ SOLN
3.0000 mL | INTRAMUSCULAR | Status: AC | PRN
  Administered 2022-12-11: 3 mL via INTRA_ARTICULAR

## 2022-12-11 MED ORDER — LIDOCAINE HCL 1 % IJ SOLN
3.0000 mL | INTRAMUSCULAR | Status: AC | PRN
  Administered 2022-12-11: 3 mL

## 2022-12-11 MED ORDER — LIDOCAINE HCL 1 % IJ SOLN
0.3000 mL | INTRAMUSCULAR | Status: AC | PRN
  Administered 2022-12-11: .3 mL

## 2022-12-11 MED ORDER — TRAMADOL HCL 50 MG PO TABS: 50.0000 mg | ORAL_TABLET | Freq: Two times a day (BID) | ORAL | 2 refills | Status: DC | PRN

## 2022-12-11 MED ORDER — METHYLPREDNISOLONE ACETATE 40 MG/ML IJ SUSP
40.0000 mg | INTRAMUSCULAR | Status: AC | PRN
  Administered 2022-12-11: 40 mg via INTRA_ARTICULAR
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
## 2022-12-24 ENCOUNTER — Ambulatory Visit: Payer: Commercial Managed Care - PPO | Admitting: Physical Therapy

## 2022-12-24 NOTE — Therapy (Incomplete)
OUTPATIENT PHYSICAL THERAPY THORACOLUMBAR EVALUATION   Patient Name: Stacey Sparks MRN: [id number] DOB:May 20, 1981, 42 y.o., female Today's Date: 12/24/2022  END OF SESSION:   Past Medical History:  Diagnosis Date   Abnormal Pap smear    Headache(784.0)    migraine   Ovarian cyst    Preeclampsia postpartum   S/P tubal ligation 01/24/2012   Past Surgical History:  Procedure Laterality Date   FEMUR SURGERY  1996   TUBAL LIGATION  01/24/2012   Procedure: POST PARTUM TUBAL LIGATION;  Surgeon: Thornell Sartorius, MD;  Location: [facility];  Service: Gynecology;  Laterality: Bilateral;   Patient Active Problem List   Diagnosis Date Noted   Substance induced mood disorder ([place]) 06/10/2018   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  ONSET DATE: ***  SUBJECTIVE:                                                                                                                                                                                           SUBJECTIVE STATEMENT: ***  PERTINENT HISTORY:  depression  PAIN:  Are you having pain? Yes: {yespain:27235::"NPRS scale: ***/10","Pain location: ***","Pain description: ***","Aggravating factors: ***","Relieving factors: ***"}  PRECAUTIONS: {Therapy precautions:24002}  WEIGHT BEARING RESTRICTIONS: No  FALLS:  Has patient fallen in last 6 months? {fallsyesno:27318}  LIVING ENVIRONMENT: Lives with: {OPRC lives with:25569::"lives with their family"} Lives in: {Lives in:25570} Stairs: {opstairs:27293} Has following equipment at home: {Assistive devices:23999}  OCCUPATION: ***  PLOF: {PLOF:24004}  PATIENT GOALS: ***  NEXT MD VISIT: PRN  OBJECTIVE:   DIAGNOSTIC FINDINGS:   Xray: Mild degenerative changes to the left hip joint with a pincer defect   PATIENT SURVEYS:  12/24/22: {rehab surveys:24030}  SCREENING FOR RED FLAGS: Bowel or bladder incontinence: {GHW/EX:937169678} Spinal tumors: {Yes/No:304960894} Cauda equina syndrome: {Yes/No:304960894} Compression fracture: {Yes/No:304960894} Abdominal aneurysm: {Yes/No:304960894}  COGNITION: Overall cognitive status: {cognition:24006}     SENSATION: 12/24/22: {sensation:27233}  MUSCLE LENGTH: 12/24/22 Hamstrings: Right *** deg; Left *** deg Marcello Moores test: Right *** deg; Left *** deg  POSTURE: {posture:25561}  PALPATION: 12/24/22: ***  LUMBAR ROM:   AROM eval  Flexion   Extension   Right lateral flexion   Left lateral flexion   Right rotation   Left rotation    (Blank rows = not tested)  LOWER EXTREMITY ROM:     {AROM/PROM:27142}  Right eval Left eval  Hip flexion    Hip  extension    Hip abduction    Hip adduction    Hip internal rotation    Hip external rotation    Knee flexion    Knee extension    Ankle dorsiflexion    Ankle plantarflexion    Ankle inversion    Ankle eversion     (Blank rows = not tested)  LOWER EXTREMITY MMT:    MMT Right eval Left eval  Hip flexion    Hip extension    Hip abduction    Hip adduction    Hip internal rotation    Hip external rotation    Knee flexion    Knee extension    Ankle dorsiflexion    Ankle plantarflexion    Ankle inversion    Ankle eversion     (Blank rows = not tested)  LUMBAR SPECIAL TESTS:  12/24/22: {lumbar special test:25242}  FUNCTIONAL TESTS:  12/24/22: {Functional tests:24029}  GAIT: 12/24/22 Distance walked: *** Assistive device utilized: {Assistive devices:23999} Level of assistance: {Levels of assistance:24026} Comments: ***  TODAY'S TREATMENT:                                                                                                                              DATE:  12/24/22 ***    PATIENT EDUCATION:   Education details: HEP Person educated: {Person educated:25204} Education method: Explanation, Demonstration, and Handouts Education comprehension: verbalized understanding, returned demonstration, and needs further education  HOME EXERCISE PROGRAM: ***  ASSESSMENT:  CLINICAL IMPRESSION: Patient is a 42 y.o. female who was seen today for physical therapy evaluation and treatment for Lt hip pain.  Pt demonstrates ***.  She will benefit from PT to address deficits listed.   OBJECTIVE IMPAIRMENTS: {opptimpairments:25111}.   ACTIVITY LIMITATIONS: {activitylimitations:27494}  PARTICIPATION LIMITATIONS: {participationrestrictions:25113}  PERSONAL FACTORS: 1 comorbidity: depression  are also affecting patient's functional outcome.   REHAB POTENTIAL: {rehabpotential:25112}  CLINICAL DECISION MAKING: {clinical decision making:25114}  EVALUATION COMPLEXITY: {Evaluation complexity:25115}   GOALS: Goals reviewed with patient? Yes  SHORT TERM GOALS: Target date: ***  Independent with initial HEP Goal status: INITIAL  2.  *** Goal status: {GOALSTATUS:25110}  3.  *** Goal status: {GOALSTATUS:25110}   LONG TERM GOALS: Target date: ***  Independent with final HEP Goal status: INITIAL  2.  FOTO score improved to *** Goal status: INITIAL  3.  *** improved to *** for *** Goal status: INIITAL  4.  Report pain < ***/10 with *** for improved function Goal status: INITIAL  5.  *** Goal status: {GOALSTATUS:25110}  6.  *** Goal status: {GOALSTATUS:25110}    PLAN:  PT FREQUENCY: {rehab frequency:25116}  PT DURATION: {rehab duration:25117}  PLANNED INTERVENTIONS: {rehab planned interventions:25118::"Therapeutic exercises","Therapeutic activity","Neuromuscular re-education","Balance training","Gait training","Patient/Family education","Self Care","Joint mobilization"}.  PLAN FOR NEXT SESSION: ***   Faustino Congress, PT 12/24/2022, 7:46 AM

## 2022-12-30 ENCOUNTER — Other Ambulatory Visit (HOSPITAL_COMMUNITY)
Admission: RE | Admit: 2022-12-30 | Discharge: 2022-12-30 | Disposition: A | Discharge status: Home / Self Care | Payer: Commercial Managed Care - PPO | Source: Ambulatory Visit | Attending: Nurse Practitioner | Admitting: Nurse Practitioner

## 2022-12-30 ENCOUNTER — Ambulatory Visit: Payer: Commercial Managed Care - PPO | Admitting: Nurse Practitioner

## 2022-12-30 ENCOUNTER — Encounter: Payer: Self-pay | Admitting: Nurse Practitioner

## 2022-12-30 VITALS — BP 118/80 | Ht 67.0 in | Wt 175.0 lb

## 2022-12-30 DIAGNOSIS — N920 Excessive and frequent menstruation with regular cycle: Secondary | ICD-10-CM

## 2022-12-30 DIAGNOSIS — Z124 Encounter for screening for malignant neoplasm of cervix: Secondary | ICD-10-CM | POA: Diagnosis not present

## 2022-12-30 DIAGNOSIS — N898 Other specified noninflammatory disorders of vagina: Secondary | ICD-10-CM

## 2022-12-30 DIAGNOSIS — N946 Dysmenorrhea, unspecified: Secondary | ICD-10-CM | POA: Diagnosis not present

## 2022-12-30 LAB — WET PREP FOR TRICH, YEAST, CLUE

## 2022-12-30 NOTE — Progress Notes (Signed)
   Acute Office Visit  Subjective:    Patient ID: Stacey Sparks, female    DOB: 1981-08-19, 42 y.o.   MRN: [id number]   HPI 42 y.o. [id number] presents as new patient for heavy, painful periods. Has always had heavy menses but feels it has worsened since having her tubal ligation in 2013. Menses are regular. Heavy bleeding all 7 days that requires changing of pads and tampons about every hour. Severe dysmenorrhea. Always feels cold. Smoker. Normal pap history, unsure when last one was.    Review of Systems  Constitutional: Negative.   Genitourinary:  Positive for menstrual problem (heavy, painful).       Objective:    Physical Exam Constitutional:      Appearance: Normal appearance.  Genitourinary:    General: Normal vulva.     Vagina: Vaginal discharge present. No erythema.     Cervix: Normal.     Uterus: Normal.      Adnexa: Right adnexa normal and left adnexa normal.     BP 118/80 (BP Location: Right Arm, Patient Position: Sitting, Cuff Size: Normal)   Ht 5\' 7"  (1.702 m)   Wt 175 lb (79.4 kg)   LMP 12/08/2022   BMI 27.41 kg/m  Wt Readings from Last 3 Encounters:  12/30/22 175 lb (79.4 kg)  12/02/22 179 lb 6.4 oz (81.4 kg)  07/27/19 165 lb (74.8 kg)        Patient informed chaperone available to be present for breast and/or pelvic exam. Patient has requested no chaperone to be present. Patient has been advised what will be completed during breast and pelvic exam.   Wet prep negative for pathogens  Assessment & Plan:   Problem List Items Addressed This Visit   None Visit Diagnoses     Menorrhagia with regular cycle    -  Primary   Relevant Orders   TSH   CBC with Differential/Platelet   US PELVIS TRANSVAGINAL NON-OB (TV ONLY)   Screening for cervical cancer       Relevant Orders   Cytology - PAP( [place])   Vaginal discharge       Relevant Orders   WET PREP FOR TRICH, YEAST, CLUE   Dysmenorrhea       Relevant Orders   US PELVIS TRANSVAGINAL  NON-OB (TV ONLY)      Plan: Will schedule pelvic ultrasound. CBC to check for anemia, TSH. Negative wet prep. Pap pending. Ibuprofen 800 mg every 8 hours as needed.      Stacey Gammon DNP, 11:49 AM 12/30/2022

## 2022-12-31 LAB — CBC WITH DIFFERENTIAL/PLATELET
Absolute Monocytes: 413 cells/uL (ref 200–950)
Basophils Absolute: 53 cells/uL (ref 0–200)
Basophils Relative: 0.9 %
Eosinophils Absolute: 112 cells/uL (ref 15–500)
Eosinophils Relative: 1.9 %
HCT: 34.8 % — ABNORMAL LOW (ref 35.0–45.0)
Hemoglobin: 12.2 g/dL (ref 11.7–15.5)
Lymphs Abs: 2596 cells/uL (ref 850–3900)
MCH: 35.3 pg — ABNORMAL HIGH (ref 27.0–33.0)
MCHC: 35.1 g/dL (ref 32.0–36.0)
MCV: 100.6 fL — ABNORMAL HIGH (ref 80.0–100.0)
MPV: 10 fL (ref 7.5–12.5)
Monocytes Relative: 7 %
Neutro Abs: 2726 cells/uL (ref 1500–7800)
Neutrophils Relative %: 46.2 %
Platelets: 314 10*3/uL (ref 140–400)
RBC: 3.46 10*6/uL — ABNORMAL LOW (ref 3.80–5.10)
RDW: 12.5 % (ref 11.0–15.0)
Total Lymphocyte: 44 %
WBC: 5.9 10*3/uL (ref 3.8–10.8)

## 2022-12-31 LAB — TSH: TSH: 2.05 mIU/L

## 2023-01-01 LAB — CYTOLOGY - PAP
Comment: NEGATIVE
Diagnosis: NEGATIVE
High risk HPV: NEGATIVE

## 2023-01-05 ENCOUNTER — Telehealth: Payer: Self-pay

## 2023-01-05 ENCOUNTER — Telehealth: Payer: Self-pay | Admitting: Physician Assistant

## 2023-01-05 DIAGNOSIS — D649 Anemia, unspecified: Secondary | ICD-10-CM

## 2023-01-05 DIAGNOSIS — R718 Other abnormality of red blood cells: Secondary | ICD-10-CM

## 2023-01-05 NOTE — Telephone Encounter (Signed)
Scheduled appt per 2/19 referral. Pt is aware of appt date and time. Pt is aware to arrive 15 mins prior to appt time and to bring and updated insurance card. Pt is aware of appt location.

## 2023-01-05 NOTE — Telephone Encounter (Signed)
Her blood counts are abnormal and looks like this has been ongoing for years. I would like to refer her to hematology

## 2023-01-05 NOTE — Telephone Encounter (Signed)
Order placed in Epic.

## 2023-01-06 NOTE — Telephone Encounter (Signed)
Hematology appt scheduled 02/04/23 at 9:00am.

## 2023-01-29 ENCOUNTER — Other Ambulatory Visit: Payer: Commercial Managed Care - PPO

## 2023-01-29 ENCOUNTER — Other Ambulatory Visit: Payer: Commercial Managed Care - PPO | Admitting: Nurse Practitioner

## 2023-02-04 ENCOUNTER — Inpatient Hospital Stay: Payer: Commercial Managed Care - PPO

## 2023-02-04 ENCOUNTER — Inpatient Hospital Stay: Payer: Commercial Managed Care - PPO | Attending: Physician Assistant | Admitting: Physician Assistant

## 2023-02-04 ENCOUNTER — Encounter: Payer: Self-pay | Admitting: Physician Assistant

## 2023-02-04 VITALS — BP 118/70 | HR 89 | Temp 97.7°F | Resp 16 | Ht 67.0 in | Wt 176.1 lb

## 2023-02-04 DIAGNOSIS — N924 Excessive bleeding in the premenopausal period: Secondary | ICD-10-CM

## 2023-02-04 DIAGNOSIS — K5900 Constipation, unspecified: Secondary | ICD-10-CM | POA: Diagnosis not present

## 2023-02-04 DIAGNOSIS — D7589 Other specified diseases of blood and blood-forming organs: Secondary | ICD-10-CM | POA: Diagnosis not present

## 2023-02-04 LAB — CMP (CANCER CENTER ONLY)
ALT: 11 U/L (ref 0–44)
AST: 13 U/L — ABNORMAL LOW (ref 15–41)
Albumin: 4.6 g/dL (ref 3.5–5.0)
Alkaline Phosphatase: 48 U/L (ref 38–126)
Anion gap: 5 (ref 5–15)
BUN: 6 mg/dL (ref 6–20)
CO2: 28 mmol/L (ref 22–32)
Calcium: 9.8 mg/dL (ref 8.9–10.3)
Chloride: 106 mmol/L (ref 98–111)
Creatinine: 0.77 mg/dL (ref 0.44–1.00)
GFR, Estimated: >60 mL/min (ref >60)
Glucose, Bld: 85 mg/dL (ref 70–99)
Potassium: 3.6 mmol/L (ref 3.5–5.1)
Sodium: 139 mmol/L (ref 135–145)
Total Bilirubin: 0.6 mg/dL (ref 0.3–1.2)
Total Protein: 7.7 g/dL (ref 6.5–8.1)

## 2023-02-04 LAB — CBC WITH DIFFERENTIAL (CANCER CENTER ONLY)
Abs Immature Granulocytes: 0.01 10*3/uL (ref 0.00–0.07)
Basophils Absolute: 0.1 10*3/uL (ref 0.0–0.1)
Basophils Relative: 1 %
Eosinophils Absolute: 0.3 10*3/uL (ref 0.0–0.5)
Eosinophils Relative: 5 %
HCT: 37.1 % (ref 36.0–46.0)
Hemoglobin: 12.3 g/dL (ref 12.0–15.0)
Immature Granulocytes: 0 %
Lymphocytes Relative: 46 %
Lymphs Abs: 2.7 10*3/uL (ref 0.7–4.0)
MCH: 34.3 pg — ABNORMAL HIGH (ref 26.0–34.0)
MCHC: 33.2 g/dL (ref 30.0–36.0)
MCV: 103.3 fL — ABNORMAL HIGH (ref 80.0–100.0)
Monocytes Absolute: 0.5 10*3/uL (ref 0.1–1.0)
Monocytes Relative: 8 %
Neutro Abs: 2.4 10*3/uL (ref 1.7–7.7)
Neutrophils Relative %: 40 %
Platelet Count: 356 10*3/uL (ref 150–400)
RBC: 3.59 MIL/uL — ABNORMAL LOW (ref 3.87–5.11)
RDW: 12.9 % (ref 11.5–15.5)
WBC Count: 5.9 10*3/uL (ref 4.0–10.5)
nRBC: 0 % (ref 0.0–0.2)

## 2023-02-04 LAB — IRON AND IRON BINDING CAPACITY (CC-WL,HP ONLY)
Iron: 122 ug/dL (ref 28–170)
Saturation Ratios: 32 % — ABNORMAL HIGH (ref 10.4–31.8)
TIBC: 384 ug/dL (ref 250–450)
UIBC: 262 ug/dL (ref 148–442)

## 2023-02-04 LAB — FOLATE: Folate: 6.9 ng/mL (ref >5.9)

## 2023-02-04 LAB — FERRITIN: Ferritin: 16 ng/mL (ref 11–307)

## 2023-02-04 LAB — VITAMIN B12: Vitamin B-12: 360 pg/mL (ref 180–914)

## 2023-02-04 NOTE — Progress Notes (Signed)
[place] [phone]   Fax:([phone]) [place] NOTE  Patient Care Team: Dorna Mai, MD as PCP - General (Family Medicine)  Hematological/Oncological History 06/07/2018: WBC 7.5, RBC 3.64 (L) Hgb 12.3, MCV 106.9 (H), Plt 294 07/27/2019: WBC 7.5, RBC 3.29 (L), Hgb 11.8 (L), MCV 106.7 (H), Plt 277 12/30/2022: WBC 5.9, RBC 3.46 (L), Hgb 12.2, MCV 100.6 (H), Plt 314 02/04/2023: Establish care with [facility]  CHIEF COMPLAINTS/PURPOSE OF CONSULTATION:  "Low RBC and Macrocytosis "  HISTORY OF PRESENTING ILLNESS:  Stacey Sparks 42 y.o. female with medical history significant for headaches, and ovarian cyst presents to the hematology clinic for evaluation of low RBC and macrocytosis. She is unaccompanied for this visit.   On exam today, Stacey Sparks reports persistent fatigue although she Sparks complete her ADLs on her own. She denies any dietary restrictions or weight changes. She has occasional episodes of nausea without vomiting. She has chronic constipation. Her last bowel movement was two days ago. She takes supportive medications such as probiotics to help with constipation. She denies easy bruising or signs of active bleeding. Her menstrual cycles have been heavy for several years. Each cycle last 7 days with 5 days of heavy bleeding. She is under the care of OB/GYN and plans to under diagnostic evaluation with pelvic US. She does crave ice and is cold natured. She denies fevers, chills, sweats, shortness of breath, chest pain or cough. She has no other complaints. Rest of the 10 point ROS is below.   MEDICAL HISTORY:  Past Medical History:  Diagnosis Date   Abnormal Pap smear    Headache(784.0)    migraine   Ovarian cyst    Preeclampsia postpartum   S/P tubal ligation 01/24/2012    SURGICAL HISTORY: Past Surgical History:  Procedure Laterality Date   FEMUR SURGERY  1996   TUBAL LIGATION  01/24/2012   Procedure: POST PARTUM TUBAL  LIGATION;  Surgeon: Thornell Sartorius, MD;  Location: [facility];  Service: Gynecology;  Laterality: Bilateral;    SOCIAL HISTORY: Social History   Socioeconomic History   Marital status: Single    Spouse name: Not on file   Number of children: Not on file   Years of education: Not on file   Highest education level: Not on file  Occupational History   Not on file  Tobacco Use   Smoking status: Every Day    Packs/day: 0.50    Years: 20.00    Additional pack years: 0.00    Total pack years: 10.00    Types: Cigarettes   Smokeless tobacco: Never  Vaping Use   Vaping Use: Never used  Substance and Sexual Activity   Alcohol use: Yes    Comment: occasional use only   Drug use: Yes    Types: Marijuana   Sexual activity: Yes    Partners: Male    Birth control/protection: None  Other Topics Concern   Not on file  Social History Narrative   Not on file   Social Determinants of Health   Financial Resource Strain: Not on file  Food Insecurity: Food Insecurity Present (02/04/2023)   Hunger Vital Sign    Worried About Running Out of Food in the Last Year: Sometimes true    Ran Out of Food in the Last Year: Never true  Transportation Needs: No Transportation Needs (02/04/2023)   PRAPARE - Hydrologist (Medical): No    Lack of Transportation (Non-Medical): No  Physical Activity: Not on file  Stress: Not on file  Social Connections: Not on file  Intimate Partner Violence: Not At Risk (02/04/2023)   Humiliation, Afraid, Rape, and Kick questionnaire    Fear of Current or Ex-Partner: No    Emotionally Abused: No    Physically Abused: No    Sexually Abused: No    FAMILY HISTORY: Family History  Problem Relation Age of Onset   Arthritis Mother    Diabetes Mother    Migraines Brother    Diabetes Maternal Aunt    Mental retardation Maternal Aunt        schizophrenia   Diabetes Maternal Uncle     ALLERGIES:  has No Known Allergies.  MEDICATIONS:  Current  Outpatient Medications  Medication Sig Dispense Refill   traMADol (ULTRAM) 50 MG tablet Take 1 tablet (50 mg total) by mouth every 12 (twelve) hours as needed. 30 tablet 2   No current facility-administered medications for this visit.    REVIEW OF SYSTEMS:   Constitutional: ( - ) fevers, ( - )  chills , ( - ) night sweats Eyes: ( - ) blurriness of vision, ( - ) double vision, ( - ) watery eyes Ears, nose, mouth, throat, and face: ( - ) mucositis, ( - ) sore throat Respiratory: ( - ) cough, ( - ) dyspnea, ( - ) wheezes Cardiovascular: ( - ) palpitation, ( - ) chest discomfort, ( - ) lower extremity swelling Gastrointestinal:  ( + ) nausea, ( - ) heartburn, ( - ) change in bowel habits Skin: ( - ) abnormal skin rashes Lymphatics: ( - ) new lymphadenopathy, ( - ) easy bruising Neurological: ( - ) numbness, ( - ) tingling, ( - ) new weaknesses Behavioral/Psych: ( - ) mood change, ( - ) new changes  All other systems were reviewed with the patient and are negative.  PHYSICAL EXAMINATION: ECOG PERFORMANCE STATUS: 1 - Symptomatic but completely ambulatory  Vitals:   02/04/23 0908  BP: 118/70  Pulse: 89  Resp: 16  Temp: 97.7 F (36.5 C)  SpO2: 100%   Filed Weights   02/04/23 0908  Weight: 176 lb 1.6 oz (79.9 kg)    GENERAL: well appearing female in NAD  SKIN: skin color, texture, turgor are normal, no rashes or significant lesions EYES: conjunctiva are pink and non-injected, sclera clear OROPHARYNX: no exudate, no erythema; lips, buccal mucosa, and tongue normal  NECK: supple, non-tender LYMPH:  no palpable lymphadenopathy in the cervical or supraclavicular lymph nodes.  LUNGS: clear to auscultation and percussion with normal breathing effort HEART: regular rate & rhythm and no murmurs and no lower extremity edema Musculoskeletal: no cyanosis of digits and no clubbing  PSYCH: alert & oriented x 3, fluent speech NEURO: no focal motor/sensory deficits  LABORATORY DATA:  I have  reviewed the data as listed    Latest Ref Rng & Units 12/30/2022   11:43 AM 07/27/2019    4:06 AM 06/07/2018    2:55 PM  CBC  WBC 3.8 - 10.8 Thousand/uL 5.9  7.5  7.5   Hemoglobin 11.7 - 15.5 g/dL 12.2  11.8  12.3   Hematocrit 35.0 - 45.0 % 34.8  35.1  38.9   Platelets 140 - 400 Thousand/uL 314  277  294        Latest Ref Rng & Units 07/27/2019    4:06 AM 06/07/2018    2:55 PM 10/18/2013    3:35 PM  CMP  Glucose 70 -  99 mg/dL 105  79  86   BUN 6 - 20 mg/dL 12  9  6    Creatinine 0.44 - 1.00 mg/dL 0.86  0.99  0.69   Sodium 135 - 145 mmol/L 136  137  137   Potassium 3.5 - 5.1 mmol/L 3.8  4.2  3.8   Chloride 98 - 111 mmol/L 106  103  102   CO2 22 - 32 mmol/L 25  23  24    Calcium 8.9 - 10.3 mg/dL 9.8  9.5  9.0   Total Protein 6.5 - 8.1 g/dL 7.4  7.4    Total Bilirubin 0.3 - 1.2 mg/dL 0.7  1.0    Alkaline Phos 38 - 126 U/L 46  53    AST 15 - 41 U/L 18  22    ALT 0 - 44 U/L 17  13      ASSESSMENT & PLAN Stacey Sparks is a 42 y.o. female who presents to the hematology clinic for evaluation of low RBC and macrocytosis.   #Low RBC: --Etiology includes iron deficiency due to heavy menstrual bleeding --Labs today to check ferritin, iron and TIBC levels --If there is evidence of iron deficiency, recommend IV iron since patient struggles with chronic constipation.   #Macrocytosis: --Etiology unknown but will check for vitamin B12 and folate deficiencies.   #Constipation: --Encouraged patient to try OTC miralax and/or senakot.    Orders Placed This Encounter  Procedures   CBC with Differential ([place] Only)    Standing Status:   Future    Standing Expiration Date:   02/04/2024   CMP ([place] only)    Standing Status:   Future    Standing Expiration Date:   02/04/2024   Ferritin    Standing Status:   Future    Standing Expiration Date:   02/04/2024   Iron and Iron Binding Capacity ([facility] only)    Standing Status:   Future    Standing Expiration Date:    02/04/2024   Vitamin B12    Standing Status:   Future    Standing Expiration Date:   02/04/2024   Methylmalonic acid, serum    Standing Status:   Future    Standing Expiration Date:   02/04/2024   Folate, Serum    Standing Status:   Future    Standing Expiration Date:   02/04/2024    All questions were answered. The patient knows to call the clinic with any problems, questions or concerns.  I have spent a total of 60 minutes minutes of face-to-face and non-face-to-face time, preparing to see the patient, obtaining and/or reviewing separately obtained history, performing a medically appropriate examination, counseling and educating the patient, ordering tests/procedures, documenting clinical information in the electronic health record,  and care coordination.   Dede Query, PA-C Department of Hematology/Oncology [place] at [facility] Phone: [phone]  Patient was seen with Dr. Lorenso Courier  I have read the above note and personally examined the patient. I agree with the assessment and plan as noted above.  Briefly Stacey Sparks is a 42 year old female who presents for evaluation of macrocytosis without anemia.  There are numerous possible etiologies for macrocytosis without anemia.  First our concern would be for vitamin B12 deficiency or folate deficiency.  Will check these nutritional levels today.  Additionally were about hemolysis and will check LDH and reticulocyte panel.  Other possible findings could include alcohol ingestion (though the patient denies alcohol consumption) or bone  marrow dysfunction.  Pending results of above studies we will determine if further evaluation is needed.  Of note rarely is a bone marrow biopsy performed for macrocytosis without anemia as it is low yield.   Ledell Peoples, MD Department of Hematology/Oncology [place] at [facility] Phone: [phone] Pager: [phone] Email:  [email or web address]

## 2023-02-05 ENCOUNTER — Telehealth: Payer: Self-pay | Admitting: Physician Assistant

## 2023-02-05 NOTE — Telephone Encounter (Signed)
Per 3/20 LOS reached out to schedule patient.

## 2023-02-10 LAB — METHYLMALONIC ACID, SERUM: Methylmalonic Acid, Quantitative: 264 nmol/L (ref 0–378)

## 2023-02-12 ENCOUNTER — Ambulatory Visit (INDEPENDENT_AMBULATORY_CARE_PROVIDER_SITE_OTHER): Payer: Commercial Managed Care - PPO

## 2023-02-12 ENCOUNTER — Other Ambulatory Visit: Payer: Self-pay | Admitting: Physician Assistant

## 2023-02-12 ENCOUNTER — Encounter: Payer: Self-pay | Admitting: Nurse Practitioner

## 2023-02-12 ENCOUNTER — Ambulatory Visit (INDEPENDENT_AMBULATORY_CARE_PROVIDER_SITE_OTHER): Payer: Commercial Managed Care - PPO | Admitting: Nurse Practitioner

## 2023-02-12 VITALS — BP 118/70 | HR 74

## 2023-02-12 DIAGNOSIS — N920 Excessive and frequent menstruation with regular cycle: Secondary | ICD-10-CM | POA: Diagnosis not present

## 2023-02-12 DIAGNOSIS — N946 Dysmenorrhea, unspecified: Secondary | ICD-10-CM

## 2023-02-12 MED ORDER — FERROUS SULFATE 325 (65 FE) MG PO TBEC: 325.0000 mg | DELAYED_RELEASE_TABLET | Freq: Every day | ORAL | 3 refills | Status: DC

## 2023-02-12 MED ORDER — MEDROXYPROGESTERONE ACETATE 150 MG/ML IM SUSY
150.0000 mg | PREFILLED_SYRINGE | Freq: Once | INTRAMUSCULAR | Status: AC
  Administered 2023-02-12: 150 mg via INTRAMUSCULAR

## 2023-02-12 MED ORDER — B COMPLEX VITAMINS PO CAPS: 1.0000 | ORAL_CAPSULE | Freq: Every day | ORAL | 0 refills | Status: DC

## 2023-02-12 NOTE — Progress Notes (Signed)
   Acute Office Visit  Subjective:    Patient ID: Stacey Sparks, female    DOB: 1981/04/25, 42 y.o.   MRN: [id number]   HPI 42 y.o. presents today for ultrasound. Seen 12/30/2022 as new patient for heavy, painful periods. Has always had heavy menses but feels it has worsened since having her tubal ligation in 2013. Menses are regular. Heavy bleeding all 7 days that requires changing of pads and tampons about every hour. Severe dysmenorrhea. Saw hematology 02/04/23 for iron deficiency anemia and evaluation of macrocytosis. Smoker.    Review of Systems  Constitutional: Negative.   Genitourinary:  Positive for menstrual problem.       Objective:    Physical Exam Constitutional:      Appearance: Normal appearance.   GU: Not indicated  BP 118/70 (BP Location: Right Arm, Patient Position: Sitting, Cuff Size: Normal)   Pulse 74   LMP 02/05/2023   SpO2 100%  Wt Readings from Last 3 Encounters:  02/04/23 176 lb 1.6 oz (79.9 kg)  12/30/22 175 lb (79.4 kg)  12/02/22 179 lb 6.4 oz (81.4 kg)       Assessment & Plan:   Problem List Items Addressed This Visit   None Visit Diagnoses     Menorrhagia with regular cycle    -  Primary   Relevant Medications   medroxyPROGESTERone Acetate SUSY 150 mg (Completed)   Dysmenorrhea       Relevant Medications   medroxyPROGESTERone Acetate SUSY 150 mg (Completed)      Vaginal ultrasound: Anteverted uterus, normal size and shape, single 1 cm subserous fibroid posteriorly.  Symmetrical endometrium - 7.2 mm.  No masses or thickening seen.  Both ovaries normal size with normal follicle pattern and normal for perfusion.  No adnexal masses, no free fluid.  Plan: Ultrasound reviewed with patient. Reassurance that small fibroid not cause for heavy bleeding. Discussed management options. Will avoid estrogen due to smoking status. Would like Depo. She has used Depo in the past. Not sexually active since LMP 02/04/2023 due to husband's back issue.  Lysteda offered as well but she does not think she will be consistent with use.      [place], 4:30 PM 02/12/2023

## 2023-02-12 NOTE — Progress Notes (Signed)
I called [name] to review the lab results from 02/04/2023. Findings confirmed iron deficiency with ferritin level of 16. Recommend to start ferrous sulfate 325 mg once daily with a source of vitamin C. Possible side effects including constipation was reviewed the with patient. Additionally, Vitamin B12 and folate were normal. Encouraged to take a B-complex supplement as well. We will see patient back in 3 months with repeat labs.  Patient expressed understanding of the plan provided.

## 2023-03-07 ENCOUNTER — Other Ambulatory Visit: Payer: Self-pay | Admitting: Physician Assistant

## 2023-03-11 ENCOUNTER — Ambulatory Visit (INDEPENDENT_AMBULATORY_CARE_PROVIDER_SITE_OTHER): Payer: Commercial Managed Care - PPO | Admitting: Family Medicine

## 2023-03-11 ENCOUNTER — Encounter: Payer: Self-pay | Admitting: Family Medicine

## 2023-03-11 VITALS — BP 108/74 | HR 71 | Temp 98.1°F | Resp 16 | Ht 67.0 in | Wt 179.0 lb

## 2023-03-11 DIAGNOSIS — Z13220 Encounter for screening for lipoid disorders: Secondary | ICD-10-CM | POA: Diagnosis not present

## 2023-03-11 DIAGNOSIS — Z1329 Encounter for screening for other suspected endocrine disorder: Secondary | ICD-10-CM | POA: Diagnosis not present

## 2023-03-11 DIAGNOSIS — F17210 Nicotine dependence, cigarettes, uncomplicated: Secondary | ICD-10-CM

## 2023-03-11 DIAGNOSIS — Z0000 Encounter for general adult medical examination without abnormal findings: Secondary | ICD-10-CM | POA: Diagnosis not present

## 2023-03-11 DIAGNOSIS — Z130 Encounter for screening for diseases of the blood and blood-forming organs and certain disorders involving the immune mechanism: Secondary | ICD-10-CM

## 2023-03-11 DIAGNOSIS — Z1159 Encounter for screening for other viral diseases: Secondary | ICD-10-CM

## 2023-03-11 DIAGNOSIS — Z13228 Encounter for screening for other metabolic disorders: Secondary | ICD-10-CM

## 2023-03-12 LAB — LIPID PANEL
Chol/HDL Ratio: 4.6 ratio — ABNORMAL HIGH (ref 0.0–4.4)
Cholesterol, Total: 230 mg/dL — ABNORMAL HIGH (ref 100–199)
HDL: 50 mg/dL (ref >39)
LDL Chol Calc (NIH): 160 mg/dL — ABNORMAL HIGH (ref 0–99)
Triglycerides: 111 mg/dL (ref 0–149)
VLDL Cholesterol Cal: 20 mg/dL (ref 5–40)

## 2023-03-12 LAB — VITAMIN D 25 HYDROXY (VIT D DEFICIENCY, FRACTURES): Vit D, 25-Hydroxy: 11 ng/mL — ABNORMAL LOW (ref 30.0–100.0)

## 2023-03-12 LAB — HEPATITIS C ANTIBODY: Hep C Virus Ab: NONREACTIVE

## 2023-03-16 ENCOUNTER — Encounter: Payer: Self-pay | Admitting: Family Medicine

## 2023-03-16 MED ORDER — VITAMIN D (ERGOCALCIFEROL) 1.25 MG (50000 UNIT) PO CAPS: 50000.0000 [IU] | ORAL_CAPSULE | ORAL | 0 refills | Status: DC

## 2023-03-16 NOTE — Progress Notes (Signed)
Established Patient Office Visit  Subjective    Patient ID: Stacey Sparks, female    DOB: Apr 03, 1981  Age: 42 y.o. MRN: [id number]  CC:  Chief Complaint  Patient presents with   Annual Exam    HPI Stacey Sparks presents for routine annual exam. Patient denies acute complaints or concerns.    Outpatient Encounter Medications as of 03/11/2023  Medication Sig   b complex vitamins capsule Take 1 capsule by mouth daily.   ferrous sulfate 325 (65 FE) MG EC tablet Take 1 tablet (325 mg total) by mouth daily with breakfast.   traMADol (ULTRAM) 50 MG tablet Take 1 tablet (50 mg total) by mouth every 12 (twelve) hours as needed.   No facility-administered encounter medications on file as of 03/11/2023.    Past Medical History:  Diagnosis Date   Abnormal Pap smear    Headache(784.0)    migraine   Ovarian cyst    Preeclampsia postpartum   S/P tubal ligation 01/24/2012    Past Surgical History:  Procedure Laterality Date   FEMUR SURGERY  1996   TUBAL LIGATION  01/24/2012   Procedure: POST PARTUM TUBAL LIGATION;  Surgeon: Sherron Monday, MD;  Location: [facility];  Service: Gynecology;  Laterality: Bilateral;    Family History  Problem Relation Age of Onset   Arthritis Mother    Diabetes Mother    Migraines Brother    Diabetes Maternal Aunt    Mental retardation Maternal Aunt        schizophrenia   Diabetes Maternal Uncle     Social History   Socioeconomic History   Marital status: Single    Spouse name: Not on file   Number of children: Not on file   Years of education: Not on file   Highest education level: Not on file  Occupational History   Not on file  Tobacco Use   Smoking status: Every Day    Packs/day: 0.50    Years: 20.00    Additional pack years: 0.00    Total pack years: 10.00    Types: Cigarettes   Smokeless tobacco: Never  Vaping Use   Vaping Use: Never used  Substance and Sexual Activity   Alcohol use: Yes    Comment: occasional use only    Drug use: Yes    Types: Marijuana   Sexual activity: Yes    Partners: Male    Birth control/protection: None  Other Topics Concern   Not on file  Social History Narrative   Not on file   Social Determinants of Health   Financial Resource Strain: Not on file  Food Insecurity: Food Insecurity Present (02/04/2023)   Hunger Vital Sign    Worried About Running Out of Food in the Last Year: Sometimes true    Ran Out of Food in the Last Year: Never true  Transportation Needs: No Transportation Needs (02/04/2023)   PRAPARE - Administrator, Civil Service (Medical): No    Lack of Transportation (Non-Medical): No  Physical Activity: Not on file  Stress: Not on file  Social Connections: Not on file  Intimate Partner Violence: Not At Risk (02/04/2023)   Humiliation, Afraid, Rape, and Kick questionnaire    Fear of Current or Ex-Partner: No    Emotionally Abused: No    Physically Abused: No    Sexually Abused: No    Review of Systems  All other systems reviewed and are negative.       Objective  BP 108/74   Pulse 71   Temp 98.1 F (36.7 C) (Oral)   Resp 16   Ht 5\' 7"  (1.702 m)   Wt 179 lb (81.2 kg)   LMP 02/05/2023   SpO2 99%   BMI 28.04 kg/m   Physical Exam Vitals and nursing note reviewed.  Constitutional:      General: She is not in acute distress. HENT:     Head: Normocephalic and atraumatic.     Right Ear: Tympanic membrane, ear canal and external ear normal.     Left Ear: Tympanic membrane, ear canal and external ear normal.     Nose: Nose normal.     Mouth/Throat:     Mouth: Mucous membranes are moist.     Pharynx: Oropharynx is clear.  Eyes:     Conjunctiva/sclera: Conjunctivae normal.     Pupils: Pupils are equal, round, and reactive to light.  Neck:     Thyroid: No thyromegaly.  Cardiovascular:     Rate and Rhythm: Normal rate and regular rhythm.     Heart sounds: Normal heart sounds. No murmur heard. Pulmonary:     Effort: Pulmonary  effort is normal. No respiratory distress.     Breath sounds: Normal breath sounds.  Abdominal:     General: There is no distension.     Palpations: Abdomen is soft. There is no mass.     Tenderness: There is no abdominal tenderness.  Musculoskeletal:        General: Normal range of motion.     Cervical back: Normal range of motion and neck supple.  Skin:    General: Skin is warm and dry.  Neurological:     General: No focal deficit present.     Mental Status: She is alert and oriented to person, place, and time.  Psychiatric:        Mood and Affect: Mood normal.        Behavior: Behavior normal.         Assessment & Plan:   1. Annual physical exam  2. Need for hepatitis C screening test  - Hepatitis C Antibody  3. Screening for lipid disorders  - Lipid Panel  4. Screening for endocrine/metabolic/immunity disorders  - Vitamin D, 25-hydroxy    No follow-ups on file.   Tommie Raymond, MD

## 2023-04-30 ENCOUNTER — Ambulatory Visit (INDEPENDENT_AMBULATORY_CARE_PROVIDER_SITE_OTHER): Payer: Commercial Managed Care - PPO

## 2023-04-30 DIAGNOSIS — Z3042 Encounter for surveillance of injectable contraceptive: Secondary | ICD-10-CM

## 2023-04-30 MED ORDER — MEDROXYPROGESTERONE ACETATE 150 MG/ML IM SUSY
150.0000 mg | PREFILLED_SYRINGE | Freq: Once | INTRAMUSCULAR | Status: AC
  Administered 2023-04-30: 150 mg via INTRAMUSCULAR

## 2023-04-30 NOTE — Progress Notes (Signed)
Given in left hip.

## 2023-05-01 ENCOUNTER — Telehealth: Payer: Self-pay

## 2023-05-01 NOTE — Telephone Encounter (Signed)
Called and LVM alerting that 05/13/23 appts will be changed from 0815 arrival to 1045 arrival. Gave call back number with any questions.   This change is to accommodate for bone marrow bx being done by Lillard Anes, NP on a different Pt. Please call Lindsey's RN with any questions.

## 2023-05-12 ENCOUNTER — Other Ambulatory Visit: Payer: Self-pay | Admitting: Physician Assistant

## 2023-05-12 DIAGNOSIS — D7589 Other specified diseases of blood and blood-forming organs: Secondary | ICD-10-CM

## 2023-05-12 DIAGNOSIS — N924 Excessive bleeding in the premenopausal period: Secondary | ICD-10-CM

## 2023-05-13 ENCOUNTER — Ambulatory Visit: Payer: Commercial Managed Care - PPO | Admitting: Physician Assistant

## 2023-05-13 ENCOUNTER — Inpatient Hospital Stay: Payer: Commercial Managed Care - PPO

## 2023-05-13 ENCOUNTER — Other Ambulatory Visit: Payer: Commercial Managed Care - PPO

## 2023-05-13 ENCOUNTER — Telehealth: Payer: Self-pay | Admitting: Physician Assistant

## 2023-05-13 ENCOUNTER — Inpatient Hospital Stay: Payer: Commercial Managed Care - PPO | Admitting: Physician Assistant

## 2023-05-26 ENCOUNTER — Other Ambulatory Visit: Payer: Self-pay | Admitting: Physician Assistant

## 2023-05-26 DIAGNOSIS — N924 Excessive bleeding in the premenopausal period: Secondary | ICD-10-CM

## 2023-05-26 DIAGNOSIS — D7589 Other specified diseases of blood and blood-forming organs: Secondary | ICD-10-CM

## 2023-05-27 ENCOUNTER — Inpatient Hospital Stay: Payer: Commercial Managed Care - PPO | Attending: Family Medicine

## 2023-05-27 ENCOUNTER — Inpatient Hospital Stay: Payer: Commercial Managed Care - PPO | Admitting: Physician Assistant

## 2023-05-28 ENCOUNTER — Ambulatory Visit: Payer: Commercial Managed Care - PPO | Admitting: Physician Assistant

## 2023-05-28 ENCOUNTER — Encounter: Payer: Self-pay | Admitting: Physician Assistant

## 2023-05-28 ENCOUNTER — Other Ambulatory Visit (INDEPENDENT_AMBULATORY_CARE_PROVIDER_SITE_OTHER): Payer: Commercial Managed Care - PPO

## 2023-05-28 DIAGNOSIS — M545 Low back pain, unspecified: Secondary | ICD-10-CM | POA: Diagnosis not present

## 2023-05-28 DIAGNOSIS — M1812 Unilateral primary osteoarthritis of first carpometacarpal joint, left hand: Secondary | ICD-10-CM

## 2023-05-28 DIAGNOSIS — G8929 Other chronic pain: Secondary | ICD-10-CM | POA: Diagnosis not present

## 2023-05-28 MED ORDER — METHYLPREDNISOLONE ACETATE 40 MG/ML IJ SUSP
13.3300 mg | INTRAMUSCULAR | Status: AC | PRN
Start: 2023-05-28 — End: 2023-05-28
  Administered 2023-05-28: 13.33 mg

## 2023-05-28 MED ORDER — LIDOCAINE HCL 1 % IJ SOLN
3.0000 mL | INTRAMUSCULAR | Status: AC | PRN
Start: 2023-05-28 — End: 2023-05-28
  Administered 2023-05-28: 3 mL

## 2023-05-28 MED ORDER — METHOCARBAMOL 750 MG PO TABS: 750.0000 mg | ORAL_TABLET | Freq: Two times a day (BID) | ORAL | 2 refills | Status: DC | PRN

## 2023-05-28 MED ORDER — LIDOCAINE 5 % EX PTCH: 1.0000 | MEDICATED_PATCH | CUTANEOUS | 0 refills | Status: DC

## 2023-05-28 MED ORDER — PREDNISONE 10 MG (21) PO TBPK: ORAL_TABLET | ORAL | 0 refills | Status: DC

## 2023-05-28 MED ORDER — BUPIVACAINE HCL 0.25 % IJ SOLN
0.3300 mL | INTRAMUSCULAR | Status: AC | PRN
Start: 2023-05-28 — End: 2023-05-28
  Administered 2023-05-28: .33 mL

## 2023-05-28 MED ORDER — TRAMADOL HCL 50 MG PO TABS: 50.0000 mg | ORAL_TABLET | Freq: Two times a day (BID) | ORAL | 2 refills | Status: DC | PRN

## 2023-05-28 NOTE — Progress Notes (Signed)
Office Visit Note   Patient: Stacey Sparks           Date of Birth: February 13, 1981           MRN: [id number] Visit Date: 05/28/2023              Requested by: Georganna Skeans, MD [address] [place],  [place] [address] PCP: Georganna Skeans, MD   Assessment & Plan: Visit Diagnoses:  1. Chronic left-sided low back pain, unspecified whether sciatica present   2. Arthritis of carpometacarpal (CMC) joint of left thumb     Plan: Impression is left thumb CMC arthritis and left buttock pain referred from the lumbar spine.  In regards to the thumb, we have discussed repeat cortisone injection versus surgical intervention.  She would like to repeat the injection today.  In regards to the buttock pain, we have discussed starting on a steroid and muscle relaxer.  Have also provided her with a spine conditioning program to work on at home.  Should her symptoms persist or worsen over the next several weeks she will let us know we will get an MRI of the lumbar spine to rule out structural abnormalities.  Otherwise, follow-up as needed.  Follow-Up Instructions: Return if symptoms worsen or fail to improve.   Orders:  Orders Placed This Encounter  Procedures   Hand/UE Inj   XR Lumbar Spine 2-3 Views   Meds ordered this encounter  Medications   traMADol (ULTRAM) 50 MG tablet    Sig: Take 1 tablet (50 mg total) by mouth every 12 (twelve) hours as needed.    Dispense:  30 tablet    Refill:  2   methocarbamol (ROBAXIN-750) 750 MG tablet    Sig: Take 1 tablet (750 mg total) by mouth 2 (two) times daily as needed for muscle spasms.    Dispense:  20 tablet    Refill:  2   predniSONE (STERAPRED UNI-PAK 21 TAB) 10 MG (21) TBPK tablet    Sig: Take as directed    Dispense:  21 tablet    Refill:  0   lidocaine (LIDODERM) 5 %    Sig: Place 1 patch onto the skin daily. Remove & Discard patch within 12 hours or as directed by MD    Dispense:  30 patch    Refill:  0       Procedures: Hand/UE Inj: L thumb CMC for osteoarthritis on 05/28/2023 3:35 PM Medications: 3 mL lidocaine 1 %; 0.33 mL bupivacaine 0.25 %; 13.33 mg methylPREDNISolone acetate 40 MG/ML      Clinical Data: No additional findings.   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  In regards to the thumb, she has a history of CMC arthritis.  She was seen in our office in January of this past year where cortisone injection was performed.  This significantly helped until about 2 months ago.  Symptoms have returned.  She is having trouble holding things or opening jars or even pulling up her close due to the pain.  She has been wearing a wrist splint with mild relief.  NSAIDs provide minimal relief.  In regards to her back, when she was seen by [place] in January she was having pain  to the hip and back and was referred for left hip injection.  This significantly helped the hip pain but she continues to have pain which is now primarily to the left buttock.  No radiation down the leg.  She notes occasional shooting pains into the left buttock when she is reaching for things.  She also has increased pain when she is lying on her side or lying in the supine position.  She uses a heating pad without significant relief.  She has tried over-the-counter NSAIDs without long-lasting relief.  No red flag symptoms.  Review of Systems as detailed in HPI.  All others reviewed and are negative.   Objective: Vital Signs: There were no vitals taken for this visit.  Physical Exam well-developed well-nourished female no acute distress.  Alert and oriented x 3.  Ortho Exam left thumb exam reveals tenderness along the CMC joint.  Pain with grind test.  No crepitus.  Painful range of motion.  She is  neurovascular intact distally.  Lumbar spine exam: Increased pain with lumbar extension.  Positive straight leg raise.  No pain to the lumbar spinous or paraspinous areas.  No focal weakness.  She is neurovascular intact distally.  Specialty Comments:  No specialty comments available.  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   Past Medical History:  Diagnosis Date   Abnormal Pap smear    Headache(784.0)    migraine   Ovarian cyst    Preeclampsia postpartum   S/P tubal ligation 01/24/2012    Family History  Problem Relation Age of Onset   Arthritis Mother    Diabetes Mother    Migraines Brother    Diabetes Maternal Aunt    Mental retardation Maternal Aunt        schizophrenia   Diabetes Maternal Uncle     Past Surgical History:  Procedure Laterality Date   FEMUR SURGERY  1996   TUBAL LIGATION  01/24/2012   Procedure: POST PARTUM TUBAL LIGATION;  Surgeon: Sherron Monday, MD;  Location: [facility];  Service: Gynecology;  Laterality: Bilateral;   Social History   Occupational History   Not on file  Tobacco Use   Smoking status: Every Day    Current packs/day: 0.50    Average packs/day: 0.5 packs/day for 20.0 years (10.0 ttl pk-yrs)    Types: Cigarettes   Smokeless tobacco: Never  Vaping Use   Vaping status: Never Used  Substance and Sexual Activity   Alcohol use: Yes    Comment: occasional use only   Drug use: Yes    Types: Marijuana   Sexual activity: Yes    Partners: Male    Birth control/protection: None

## 2023-07-16 ENCOUNTER — Ambulatory Visit (INDEPENDENT_AMBULATORY_CARE_PROVIDER_SITE_OTHER): Payer: Commercial Managed Care - PPO

## 2023-07-16 DIAGNOSIS — N920 Excessive and frequent menstruation with regular cycle: Secondary | ICD-10-CM

## 2023-07-16 MED ORDER — MEDROXYPROGESTERONE ACETATE 150 MG/ML IM SUSY
150.0000 mg | PREFILLED_SYRINGE | Freq: Once | INTRAMUSCULAR | Status: AC
Start: 2023-07-16 — End: 2023-07-16
  Administered 2023-07-16: 150 mg via INTRAMUSCULAR

## 2023-07-16 NOTE — Progress Notes (Signed)
Depo Provera given IM RUOQ.  Patient tolerated injection well.  Next injection is due 10/01/23-10/15/23.

## 2023-07-30 ENCOUNTER — Ambulatory Visit: Payer: Commercial Managed Care - PPO

## 2023-08-04 ENCOUNTER — Ambulatory Visit: Payer: Commercial Managed Care - PPO | Admitting: Physician Assistant

## 2023-08-04 ENCOUNTER — Other Ambulatory Visit (INDEPENDENT_AMBULATORY_CARE_PROVIDER_SITE_OTHER): Payer: Commercial Managed Care - PPO

## 2023-08-04 DIAGNOSIS — M25511 Pain in right shoulder: Secondary | ICD-10-CM | POA: Diagnosis not present

## 2023-08-04 MED ORDER — BUPIVACAINE HCL 0.25 % IJ SOLN
2.0000 mL | INTRAMUSCULAR | Status: AC | PRN
Start: 2023-08-04 — End: 2023-08-04
  Administered 2023-08-04: 2 mL via INTRA_ARTICULAR

## 2023-08-04 MED ORDER — METHYLPREDNISOLONE ACETATE 40 MG/ML IJ SUSP
40.0000 mg | INTRAMUSCULAR | Status: AC | PRN
Start: 2023-08-04 — End: 2023-08-04
  Administered 2023-08-04: 40 mg via INTRA_ARTICULAR

## 2023-08-04 MED ORDER — TRAMADOL HCL 50 MG PO TABS: 50.0000 mg | ORAL_TABLET | Freq: Two times a day (BID) | ORAL | 2 refills | Status: DC | PRN

## 2023-08-04 MED ORDER — LIDOCAINE HCL 2 % IJ SOLN
2.0000 mL | INTRAMUSCULAR | Status: AC | PRN
Start: 2023-08-04 — End: 2023-08-04
  Administered 2023-08-04: 2 mL

## 2023-08-04 NOTE — Progress Notes (Signed)
Office Visit Note   Patient: Stacey Sparks           Date of Birth: 05-20-1981           MRN: [id number] Visit Date: 08/04/2023              Requested by: Georganna Skeans, MD [address] [place],  [place] [address] PCP: Georganna Skeans, MD   Assessment & Plan: Visit Diagnoses:  1. Acute pain of right shoulder     Plan: Impression is right shoulder impingement syndrome.  Today, we discussed various treatment options to include proceeding with subacromial cortisone injection for which she is agreeable to.  She will follow-up with [place] as needed.  Call with concerns or questions.  Follow-Up Instructions: Return if symptoms worsen or fail to improve.   Orders:  Orders Placed This Encounter  Procedures   Large Joint Inj: R subacromial bursa   XR Shoulder Right   Meds ordered this encounter  Medications   traMADol (ULTRAM) 50 MG tablet    Sig: Take 1 tablet (50 mg total) by mouth every 12 (twelve) hours as needed.    Dispense:  30 tablet    Refill:  2      Procedures: Large Joint Inj: R subacromial bursa on 08/04/2023 3:40 PM Indications: pain Details: 22 G needle Medications: 2 mL lidocaine 2 %; 2 mL bupivacaine 0.25 %; 40 mg methylPREDNISolone acetate 40 MG/ML Outcome: tolerated well, no immediate complications Patient was prepped and draped in the usual sterile fashion.       Clinical Data: No additional findings.   Subjective: Chief Complaint  Patient presents with   Right Shoulder - Pain    HPI patient is a pleasant 42 year old female who comes in today with right shoulder pain for the past 2 weeks.  She denies any injury or change in activity but notes she does a lot of heavy lifting at work.  The pain she has is to the proximal deltoid.  Symptoms occur when she is lifting her arms at the level of her shoulders especially when she lifts them above her head.  She also has increased pain when she is putting on her seatbelt carrying her purse or  sleeping on the right side.  She has been taking Tylenol and using Biofreeze and lidocaine patches with mild relief.  No previous cortisone injection to the right shoulder.  Review of Systems as detailed in HPI.  All others reviewed and are negative.   Objective: Vital Signs: There were no vitals taken for this visit.  Physical Exam well-developed well-nourished female no acute distress.  Alert and oriented x 3.  Ortho Exam right shoulder exam: Full active range of motion in all planes.  She does have pain with empty can testing.  Negative speeds and negative O'Brien's.  5 out of 5 strength throughout.  She is neurovascularly intact distally.  Specialty Comments:  No specialty comments available.  Imaging: XR Shoulder Right  Result Date: 08/04/2023 Mild degenerative changes to the [facility] joint.  No other acute or structural abnormality    PMFS History: Patient Active Problem List   Diagnosis Date Noted   Substance induced mood disorder (HCC) 06/10/2018   Severe recurrent major depression without psychotic features (HCC) 06/08/2018   Past Medical History:  Diagnosis Date   Abnormal Pap smear    Headache(784.0)    migraine   Ovarian cyst    Preeclampsia postpartum   S/P tubal ligation 01/24/2012  Family History  Problem Relation Age of Onset   Arthritis Mother    Diabetes Mother    Migraines Brother    Diabetes Maternal Aunt    Mental retardation Maternal Aunt        schizophrenia   Diabetes Maternal Uncle     Past Surgical History:  Procedure Laterality Date   FEMUR SURGERY  1996   TUBAL LIGATION  01/24/2012   Procedure: POST PARTUM TUBAL LIGATION;  Surgeon: Sherron Monday, MD;  Location: [facility];  Service: Gynecology;  Laterality: Bilateral;   Social History   Occupational History   Not on file  Tobacco Use   Smoking status: Every Day    Current packs/day: 0.50    Average packs/day: 0.5 packs/day for 20.0 years (10.0 ttl pk-yrs)    Types: Cigarettes   Smokeless  tobacco: Never  Vaping Use   Vaping status: Never Used  Substance and Sexual Activity   Alcohol use: Yes    Comment: occasional use only   Drug use: Yes    Types: Marijuana   Sexual activity: Yes    Partners: Male    Birth control/protection: None

## 2023-08-05 ENCOUNTER — Other Ambulatory Visit: Payer: Self-pay | Admitting: Physician Assistant

## 2024-02-02 ENCOUNTER — Other Ambulatory Visit: Payer: Self-pay

## 2024-02-02 ENCOUNTER — Ambulatory Visit
Admission: EM | Admit: 2024-02-02 | Discharge: 2024-02-02 | Disposition: A | Discharge status: Home / Self Care | Attending: Internal Medicine | Admitting: Internal Medicine

## 2024-02-02 ENCOUNTER — Encounter: Payer: Self-pay | Admitting: Emergency Medicine

## 2024-02-02 DIAGNOSIS — J069 Acute upper respiratory infection, unspecified: Secondary | ICD-10-CM | POA: Diagnosis not present

## 2024-02-02 LAB — POC COVID19/FLU A&B COMBO
Covid Antigen, POC: NEGATIVE
Influenza A Antigen, POC: NEGATIVE
Influenza B Antigen, POC: NEGATIVE

## 2024-02-02 MED ORDER — PROMETHAZINE-DM 6.25-15 MG/5ML PO SYRP: 5.0000 mL | ORAL_SOLUTION | Freq: Every evening | ORAL | 0 refills | Status: DC | PRN

## 2024-02-02 MED ORDER — BENZONATATE 100 MG PO CAPS: 100.0000 mg | ORAL_CAPSULE | Freq: Three times a day (TID) | ORAL | 0 refills | Status: DC | PRN

## 2024-02-02 MED ORDER — FLUTICASONE PROPIONATE 50 MCG/ACT NA SUSP: 1.0000 | Freq: Every day | NASAL | 0 refills | Status: DC

## 2024-02-02 NOTE — ED Provider Notes (Addendum)
 [facility]    CSN: [id number] Arrival date & time: 02/02/24  1108      History   Chief Complaint Chief Complaint  Patient presents with   Cough    HPI Stacey Sparks is a 43 y.o. female.   Patient presents with approximately 3-day history of cough and nasal congestion.  Reports that she has felt feverish as well.  Temp max at home was 101.  Reports her son has had similar symptoms.  Denies history of asthma. Has taken OTC cold medication with minimal improvement in symptoms.   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 Authorizing Provider  benzonatate (TESSALON) 100 MG capsule Take 1 capsule (100 mg total) by mouth every 8 (eight) hours as needed for cough. 02/02/24  Yes Zayanna Pundt, Rolly Salter E, FNP  fluticasone (FLONASE) 50 MCG/ACT nasal spray Place 1 spray into both nostrils daily. 02/02/24  Yes Syair Fricker, Rolly Salter E, FNP  promethazine-dextromethorphan (PROMETHAZINE-DM) 6.25-15 MG/5ML syrup Take 5 mLs by mouth at bedtime as needed. 02/02/24  Yes Trystin Hargrove, Rolly Salter E, FNP  b complex vitamins capsule Take 1 capsule by mouth daily. 02/12/23   Briant Cedar, PA-C  ferrous sulfate 325 (65 FE) MG EC tablet Take 1 tablet (325 mg total) by mouth daily with breakfast. 02/12/23   Georga Kaufmann T, PA-C  lidocaine (LIDODERM) 5 % Place 1 patch onto the skin daily. Remove & Discard patch within 12 hours or as directed by MD 05/28/23   Cristie Hem, PA-C  methocarbamol (ROBAXIN-750) 750 MG tablet Take 1 tablet (750 mg total) by mouth 2 (two) times daily as needed for muscle spasms. 05/28/23   Cristie Hem, PA-C  predniSONE (STERAPRED UNI-PAK 21 TAB) 10 MG (21) TBPK tablet Take as directed Patient not taking: Reported on 02/02/2024 05/28/23   Cristie Hem, PA-C  traMADol (ULTRAM) 50 MG tablet Take 1 tablet (50 mg total) by mouth every 12 (twelve) hours as needed. 05/28/23   Cristie Hem, PA-C  traMADol (ULTRAM) 50 MG tablet Take 1 tablet (50 mg total) by mouth every 12 (twelve) hours as needed. 08/04/23   Cristie Hem, PA-C  Vitamin D, Ergocalciferol, (DRISDOL) 1.25 MG (50000 UNIT) CAPS capsule Take 1 capsule (50,000 Units total) by mouth every 7 (seven) days. 03/16/23   Georganna Skeans, MD  Family History Family History  Problem Relation Age of Onset   Arthritis Mother    Diabetes Mother    Migraines Brother    Diabetes Maternal Aunt    Mental retardation Maternal Aunt        schizophrenia   Diabetes Maternal Uncle     Social History Social History   Tobacco Use   Smoking status: Every Day    Current packs/day: 0.50    Average packs/day: 0.5 packs/day for 20.0 years (10.0 ttl pk-yrs)    Types: Cigarettes   Smokeless tobacco: Never  Vaping Use   Vaping status: Never Used  Substance Use Topics   Alcohol use: Yes    Comment: occasional use only   Drug use: Yes    Types: Marijuana     Allergies   Patient has no known allergies.   Review of Systems Review of Systems Per HPI  Physical Exam Triage Vital Signs ED Triage Vitals  Encounter Vitals Group     BP 02/02/24 1400 (!) 127/58     Systolic BP Percentile --       Diastolic BP Percentile --      Pulse Rate 02/02/24 1400 76     Resp 02/02/24 1400 18     Temp 02/02/24 1400 98.2 F (36.8 C)     Temp Source 02/02/24 1400 Oral     SpO2 02/02/24 1400 96 %     Weight --      Height --      Head Circumference --      Peak Flow --      Pain Score 02/02/24 1401 5     Pain Loc --      Pain Education --      Exclude from Growth Chart --    No data found.  Updated Vital Signs BP (!) 127/58 (BP Location: Left Arm)   Pulse 76   Temp 98.2 F (36.8 C) (Oral)   Resp 18   SpO2 96%   Visual Acuity Right Eye Distance:   Left Eye Distance:   Bilateral Distance:    Right Eye Near:   Left Eye Near:    Bilateral Near:     Physical Exam Constitutional:      General: She is not in acute distress.    Appearance: Normal appearance. She is not toxic-appearing or diaphoretic.  HENT:     Head: Normocephalic and atraumatic.     Right Ear: Tympanic membrane and ear canal normal.     Left Ear: Tympanic membrane and ear canal normal.     Nose: Congestion present.     Mouth/Throat:     Mouth: Mucous membranes are moist.     Pharynx: No posterior oropharyngeal erythema.  Eyes:     Extraocular Movements: Extraocular movements intact.     Conjunctiva/sclera: Conjunctivae normal.     Pupils: Pupils are equal, round, and reactive to light.  Cardiovascular:     Rate and Rhythm: Normal rate and regular rhythm.     Pulses: Normal pulses.     Heart sounds: Normal heart sounds.  Pulmonary:     Effort: Pulmonary effort is normal. No respiratory distress.     Breath sounds: Normal breath sounds. No wheezing.  Musculoskeletal:        General: Normal range of motion.     Cervical back: Normal range of motion.  Skin:    General: Skin is warm and dry.  Neurological:     General: No focal deficit  present.     Mental Status: She is alert and oriented to person, place, and time. Mental status is at baseline.  Psychiatric:        Mood and Affect: Mood normal.         Behavior: Behavior normal.      UC Treatments / Results  Labs (all labs ordered are listed, but only abnormal results are displayed) Labs Reviewed  POC COVID19/FLU A&B COMBO - Normal    EKG   Radiology No results found.  Procedures Procedures (including critical care time)  Medications Ordered in UC Medications - No data to display  Initial Impression / Assessment and Plan / UC Course  I have reviewed the triage vital signs and the nursing notes.  Pertinent labs & imaging results that were available during my care of the patient were reviewed by me and considered in my medical decision making (see chart for details).     Patient presents with symptoms likely from a viral upper respiratory infection.  Do not suspect underlying cardiopulmonary process. Symptoms seem unlikely related to ACS, CHF or COPD exacerbations, pneumonia, pneumothorax. Patient is nontoxic appearing and not in need of emergent medical intervention. Covid and flu negative.   Recommended symptom control with medications, supportive care, symptom management, fluids. Advised patient to avoid use of promethazine DM and tramadol together. She states that the only medication she takes is tramadol as needed so this should be safe. Also educated patient that cough medication can cause drowsiness.   Return if symptoms fail to improve in 1-2 weeks or you develop shortness of breath, chest pain, severe headache. Patient states understanding and is agreeable.  Discharged with PCP followup.  Called pharmacy to confirm Promethazine DM is ordered to be taken as needed for cough. Final Clinical Impressions(s) / UC Diagnoses   Final diagnoses:  Viral upper respiratory tract infection with cough     Discharge Instructions      It appears that you have a viral illness that should run its course.  I have prescribed you 3 medications to help with symptoms.  Promethazine cough medication can make you sleepy so  please avoid use with tramadol.    ED Prescriptions     Medication Sig Dispense Auth. Provider   fluticasone (FLONASE) 50 MCG/ACT nasal spray Place 1 spray into both nostrils daily. 16 g Zayden Maffei, Rolly Salter E, [place]   benzonatate (TESSALON) 100 MG capsule Take 1 capsule (100 mg total) by mouth every 8 (eight) hours as needed for cough. 21 capsule [place], [place] E, [place]   promethazine-dextromethorphan (PROMETHAZINE-DM) 6.25-15 MG/5ML syrup Take 5 mLs by mouth at bedtime as needed. 118 mL Gustavus Bryant, [place]      PDMP not reviewed this encounter.   Gustavus Bryant, [place] 02/02/24 1446    Gustavus Bryant, [place] 02/02/24 1453

## 2024-02-02 NOTE — ED Triage Notes (Signed)
Pt here for cough and congestion with body aches x 3 days  

## 2024-02-02 NOTE — Discharge Instructions (Signed)
 It appears that you have a viral illness that should run its course.  I have prescribed you 3 medications to help with symptoms.  Promethazine cough medication can make you sleepy so please avoid use with tramadol.

## 2024-04-19 ENCOUNTER — Ambulatory Visit: Admitting: Nurse Practitioner

## 2024-04-19 VITALS — BP 100/62 | Ht 66.75 in | Wt 165.8 lb

## 2024-04-19 DIAGNOSIS — N920 Excessive and frequent menstruation with regular cycle: Secondary | ICD-10-CM | POA: Diagnosis not present

## 2024-04-19 DIAGNOSIS — Z113 Encounter for screening for infections with a predominantly sexual mode of transmission: Secondary | ICD-10-CM | POA: Diagnosis not present

## 2024-04-19 DIAGNOSIS — Z3042 Encounter for surveillance of injectable contraceptive: Secondary | ICD-10-CM | POA: Diagnosis not present

## 2024-04-19 DIAGNOSIS — N946 Dysmenorrhea, unspecified: Secondary | ICD-10-CM

## 2024-04-19 DIAGNOSIS — Z3009 Encounter for other general counseling and advice on contraception: Secondary | ICD-10-CM

## 2024-04-19 MED ORDER — MEDROXYPROGESTERONE ACETATE 150 MG/ML IM SUSY
150.0000 mg | PREFILLED_SYRINGE | Freq: Once | INTRAMUSCULAR | Status: AC
Start: 2024-04-19 — End: 2024-04-19
  Administered 2024-04-19: 150 mg via INTRAMUSCULAR

## 2024-04-19 NOTE — Progress Notes (Signed)
   Acute Office Visit  Subjective:    Patient ID: Stacey Sparks, female    DOB: 08-20-1981, 43 y.o.   MRN: [id number]   HPI 43 y.o. presents today for STD screening and birth control consult. Has always had heavy menses but feels it has worsened since having her tubal ligation in 2013. Menses are regular. Heavy bleeding all 7 days that requires changing of pads and tampons about every hour. Sometimes bleeding can last up to 2 weeks. Severe dysmenorrhea. Has seen hematology for iron deficiency anemia, taking iron supplement. Normal ultrasound 01/2023. Started Depo at that time and did 3 doses. Was happy with it and was starting to become amenorrheic. Smoker. No STD exposure or symptoms.   Patient's last menstrual period was 04/18/2024 (exact date).    Review of Systems  Constitutional: Negative.   Genitourinary:  Positive for menstrual problem.       Objective:     Physical Exam Constitutional:      Appearance: Normal appearance.  Genitourinary:    General: Normal vulva.     Vagina: Normal.     Cervix: Normal.     BP 100/62 (BP Location: Left Arm, Patient Position: Sitting, Cuff Size: Normal)   Ht 5' 6.75" (1.695 m)   Wt 165 lb 12.8 oz (75.2 kg)   LMP 04/18/2024 (Exact Date)   Breastfeeding No   BMI 26.16 kg/m  Wt Readings from Last 3 Encounters:  04/19/24 165 lb 12.8 oz (75.2 kg)  03/11/23 179 lb (81.2 kg)  02/04/23 176 lb 1.6 oz (79.9 kg)        Doria Garden, NP student present as chaperone.   Assessment & Plan:   Problem List Items Addressed This Visit   None Visit Diagnoses       Menorrhagia with regular cycle    -  Primary   Relevant Medications   medroxyPROGESTERone  Acetate SUSY 150 mg (Completed)     Screening examination for STD (sexually transmitted disease)       Relevant Orders   SURESWAB CT/NG/T. vaginalis   RPR   HIV Antibody (routine testing w rflx)     General counseling and advice on female contraception         Dysmenorrhea          Encounter for surveillance of injectable contraceptive          Plan: STD panel pending. Progestin-only hormonal contraceptive options were reviewed for management of heavy periods - POPs, IUD, depo, Nexplanon. Estrogen contraindicated d/t smoking status. Also option for Lysteda. Wants to restart Depo.   Return if symptoms worsen or fail to improve.    Andee Bamberger DNP, 11:50 AM 04/19/2024

## 2024-04-20 LAB — SURESWAB CT/NG/T. VAGINALIS
C. trachomatis RNA, TMA: NOT DETECTED
N. gonorrhoeae RNA, TMA: NOT DETECTED
Trichomonas vaginalis RNA: NOT DETECTED

## 2024-04-21 ENCOUNTER — Ambulatory Visit: Payer: Self-pay | Admitting: Nurse Practitioner

## 2024-07-05 ENCOUNTER — Ambulatory Visit (INDEPENDENT_AMBULATORY_CARE_PROVIDER_SITE_OTHER)

## 2024-07-05 DIAGNOSIS — Z3042 Encounter for surveillance of injectable contraceptive: Secondary | ICD-10-CM

## 2024-07-05 MED ORDER — MEDROXYPROGESTERONE ACETATE 150 MG/ML IM SUSY
150.0000 mg | PREFILLED_SYRINGE | Freq: Once | INTRAMUSCULAR | Status: AC
  Administered 2024-07-05: 150 mg via INTRAMUSCULAR

## 2024-07-05 NOTE — Progress Notes (Signed)
 Last Depo 6/3  Depo given today in the RUOQ, pt did fine   Next depo due Nov. 4th - Nov. 18th

## 2024-09-15 ENCOUNTER — Ambulatory Visit: Admitting: Physician Assistant

## 2024-09-15 ENCOUNTER — Telehealth: Payer: Self-pay | Admitting: Physician Assistant

## 2024-09-15 ENCOUNTER — Other Ambulatory Visit: Payer: Self-pay | Admitting: Physician Assistant

## 2024-09-15 DIAGNOSIS — M1812 Unilateral primary osteoarthritis of first carpometacarpal joint, left hand: Secondary | ICD-10-CM

## 2024-09-15 DIAGNOSIS — M7541 Impingement syndrome of right shoulder: Secondary | ICD-10-CM | POA: Diagnosis not present

## 2024-09-15 MED ORDER — BUPIVACAINE HCL 0.25 % IJ SOLN
2.0000 mL | INTRAMUSCULAR | Status: AC | PRN
  Administered 2024-09-15: 2 mL via INTRA_ARTICULAR

## 2024-09-15 MED ORDER — TRAMADOL HCL 50 MG PO TABS: 50.0000 mg | ORAL_TABLET | Freq: Two times a day (BID) | ORAL | 2 refills | Status: AC | PRN

## 2024-09-15 MED ORDER — METHYLPREDNISOLONE ACETATE 40 MG/ML IJ SUSP
40.0000 mg | INTRAMUSCULAR | Status: AC | PRN
  Administered 2024-09-15: 40 mg via INTRA_ARTICULAR

## 2024-09-15 MED ORDER — METHYLPREDNISOLONE ACETATE 40 MG/ML IJ SUSP
40.0000 mg | INTRAMUSCULAR | Status: AC | PRN
  Administered 2024-09-15: 40 mg

## 2024-09-15 MED ORDER — LIDOCAINE HCL 1 % IJ SOLN
1.0000 mL | INTRAMUSCULAR | Status: AC | PRN
  Administered 2024-09-15: 1 mL

## 2024-09-15 MED ORDER — LIDOCAINE 5 % EX PTCH: 1.0000 | MEDICATED_PATCH | CUTANEOUS | 0 refills | Status: AC

## 2024-09-15 MED ORDER — BUPIVACAINE HCL 0.25 % IJ SOLN
1.0000 mL | INTRAMUSCULAR | Status: AC | PRN
  Administered 2024-09-15: 1 mL

## 2024-09-15 MED ORDER — LIDOCAINE HCL 2 % IJ SOLN
2.0000 mL | INTRAMUSCULAR | Status: AC | PRN
  Administered 2024-09-15: 2 mL

## 2024-09-15 NOTE — Telephone Encounter (Signed)
 Patient aware

## 2024-09-15 NOTE — Telephone Encounter (Signed)
 sent

## 2024-09-15 NOTE — Progress Notes (Signed)
 Office Visit Note   Patient: Stacey Sparks           Date of Birth: 10-16-1981           MRN: [id number] Visit Date: 09/15/2024              Requested by: Tanda Bleacher, MD [address] [place],  [place] [address] PCP: Tanda Bleacher, MD   Assessment & Plan: Visit Diagnoses:  1. Arthritis of carpometacarpal (CMC) joint of left thumb   2. Impingement syndrome of right shoulder     Plan: Impression is right shoulder impingement syndrome and left thumb CMC arthritis.  Patient has had great relief from previous cortisone injection to both areas.  We have discussed repeating these today for which she is agreeable to.  She will follow-up with us  as needed.  Follow-Up Instructions: Return if symptoms worsen or fail to improve.   Orders:  Orders Placed This Encounter  Procedures   Large Joint Inj: R subacromial bursa   Hand/UE Inj: L thumb CMC   No orders of the defined types were placed in this encounter.     Procedures: Large Joint Inj: R subacromial bursa on 09/15/2024 11:03 AM Indications: pain Details: 22 G needle Medications: 2 mL lidocaine  2 %; 2 mL bupivacaine  0.25 %; 40 mg methylPREDNISolone  acetate 40 MG/ML Outcome: tolerated well, no immediate complications Patient was prepped and draped in the usual sterile fashion.    Hand/UE Inj: L thumb CMC for osteoarthritis on 09/15/2024 11:03 AM Medications: 1 mL lidocaine  1 %; 1 mL bupivacaine  0.25 %; 40 mg methylPREDNISolone  acetate 40 MG/ML      Clinical Data: No additional findings.   Subjective: Chief Complaint  Patient presents with   Right Shoulder - Pain   Left Hand - Pain    HPI is a very pleasant 43 year old female who comes in today with recurrent right shoulder and left thumb pain.  Regards to her right shoulder, she was seen by me in September of last year.  We proceeded with subacromial cortisone injection at that time which provided great relief until about a month ago.  Her  symptoms have returned.  She denies any injury.  All of her pain is to the entire shoulder with some radiation into the proximal deltoid.  She notes a constant throb but worsening pain with any movement of the shoulder.  She has been taking ibuprofen  without relief.  Other issue she brings up today is left thumb pain.  History of CMC osteoarthritis.  She was seen last summer where left thumb CMC joint was injected with cortisone.  She had great relief until about a month or so ago.  Symptoms have returned.  Pain is aggravated with grabbing as well as when she is pulling up her pants or opening things.  Review of Systems as detailed in HPI.  All others viewed are negative.   Objective: Vital Signs: There were no vitals taken for this visit.  Physical Exam well-developed well-nourished female in no acute distress.  Alert and oriented x 3.  Ortho Exam right shoulder exam: Near full active range of motion in all planes.  She does have pain with empty can testing.  Negative speeds negative O'Brien's.  Full strength throughout.  She is neurovascular intact distally.  Left thumb exam reveals tenderness along the CMC joint.  Pain with grind test.  No crepitus with grind test.  She is neurovascular intact distally.  Specialty Comments:  No specialty comments  available.  Imaging: No new imaging   PMFS History: Patient Active Problem List   Diagnosis Date Noted   Substance induced mood disorder (HCC) 06/10/2018   Severe recurrent major depression without psychotic features (HCC) 06/08/2018   Past Medical History:  Diagnosis Date   Abnormal Pap smear    Headache(784.0)    migraine   Ovarian cyst    Preeclampsia postpartum   S/P tubal ligation 01/24/2012    Family History  Problem Relation Age of Onset   Arthritis Mother    Diabetes Mother    Migraines Brother    Diabetes Maternal Aunt    Mental retardation Maternal Aunt        schizophrenia   Diabetes Maternal Uncle     Past Surgical  History:  Procedure Laterality Date   FEMUR SURGERY  1996   TUBAL LIGATION  01/24/2012   Procedure: POST PARTUM TUBAL LIGATION;  Surgeon: Ezzie Marshall, MD;  Location: [facility];  Service: Gynecology;  Laterality: Bilateral;   Social History   Occupational History   Not on file  Tobacco Use   Smoking status: Every Day    Current packs/day: 0.50    Average packs/day: 0.5 packs/day for 20.0 years (10.0 ttl pk-yrs)    Types: Cigarettes   Smokeless tobacco: Never  Vaping Use   Vaping status: Never Used  Substance and Sexual Activity   Alcohol use: Yes    Comment: occasional use only   Drug use: Yes    Types: Marijuana   Sexual activity: Yes    Partners: Male    Birth control/protection: None

## 2024-09-15 NOTE — Telephone Encounter (Signed)
 Pt called stating she was seen this am and meds was n\ot sent to her pharmacy. Please send to [facility] [address] Pt phone number is [phone].

## 2024-09-19 ENCOUNTER — Encounter: Payer: Self-pay | Admitting: Radiology

## 2024-09-20 ENCOUNTER — Ambulatory Visit: Admitting: *Deleted

## 2024-09-20 DIAGNOSIS — Z3042 Encounter for surveillance of injectable contraceptive: Secondary | ICD-10-CM

## 2024-09-20 MED ORDER — MEDROXYPROGESTERONE ACETATE 150 MG/ML IM SUSY
150.0000 mg | PREFILLED_SYRINGE | Freq: Once | INTRAMUSCULAR | Status: AC
  Administered 2024-09-20: 150 mg via INTRAMUSCULAR

## 2024-11-29 ENCOUNTER — Encounter: Payer: Self-pay | Admitting: Nurse Practitioner

## 2024-11-29 ENCOUNTER — Ambulatory Visit (INDEPENDENT_AMBULATORY_CARE_PROVIDER_SITE_OTHER): Admitting: Nurse Practitioner

## 2024-11-29 VITALS — BP 118/68 | HR 70 | Resp 16 | Ht 67.0 in | Wt 151.0 lb

## 2024-11-29 DIAGNOSIS — Z01419 Encounter for gynecological examination (general) (routine) without abnormal findings: Secondary | ICD-10-CM

## 2024-11-29 DIAGNOSIS — N920 Excessive and frequent menstruation with regular cycle: Secondary | ICD-10-CM

## 2024-11-29 DIAGNOSIS — Z1331 Encounter for screening for depression: Secondary | ICD-10-CM

## 2024-11-29 DIAGNOSIS — Z1211 Encounter for screening for malignant neoplasm of colon: Secondary | ICD-10-CM

## 2024-11-29 NOTE — Progress Notes (Signed)
" ° °  Stacey Sparks 1980/12/14 [id number]   History:  44 y.o. H3E5975 presents for annual exam. H/O heavy periods. Depo. BTL. Normal pap history. Smoker.   Gynecologic History Patient's last menstrual period was 11/25/2024.   Contraception/Family planning: Depo-Provera  injections and tubal ligation Sexually active: Yes  Health Maintenance Last Pap: 12/30/2022. Results were: Normal neg HPV Last mammogram: Never Last colonoscopy: Never Last Dexa: Not indicated     11/29/2024   10:08 AM  Depression screen PHQ 2/9  Decreased Interest 0  Down, Depressed, Hopeless 0  PHQ - 2 Score 0     Past medical history, past surgical history, family history and social history were all reviewed and documented in the EPIC chart. Married. International aid/development worker at [employer]. 4 children ages 13-26.  ROS:  A ROS was performed and pertinent positives and negatives are included.  Exam:  Vitals:   11/29/24 1000  BP: 118/68  Pulse: 70  Resp: 16  Weight: 151 lb (68.5 kg)  Height: 5' 7 (1.702 m)   Body mass index is 23.65 kg/m.  General appearance:  Normal Thyroid:  Symmetrical, normal in size, without palpable masses or nodularity. Respiratory  Auscultation:  Clear without wheezing or rhonchi Cardiovascular  Auscultation:  Regular rate, without rubs, murmurs or gallops  Edema/varicosities:  Not grossly evident Abdominal  Soft,nontender, without masses, guarding or rebound.  Liver/spleen:  No organomegaly noted  Hernia:  None appreciated  Skin  Inspection:  Grossly normal Breasts: Examined lying and sitting.   Right: Without masses, retractions, nipple discharge or axillary adenopathy.   Left: Without masses, retractions, nipple discharge or axillary adenopathy. Pelvic: External genitalia:  no lesions              Urethra:  normal appearing urethra with no masses, tenderness or lesions              Bartholins and Skenes: normal                 Vagina: normal appearing vagina with  normal color and discharge, no lesions              Cervix: no lesions Bimanual Exam:  Uterus:  no masses or tenderness              Adnexa: no mass, fullness, tenderness              Rectovaginal: Deferred              Anus:  normal, no lesions  Stacey Sparks, CMA present as chaperone.   Assessment/Plan:  44 y.o. H3E5975 for annual exam.   Well female exam with routine gynecological exam - Education provided on SBEs, importance of preventative screenings, current guidelines, high calcium  diet, regular exercise, and multivitamin daily. Labs with PCP.   Depression screening - PHQ - 0  Menorrhagia with regular cycle - Depo. Good management.   Screening for colon cancer - Plan: Ambulatory referral to Gastroenterology. Average risk. Discussed current guidelines.  Screening for cervical cancer - Normal Pap history. Will repeat at 5-year interval per guidelines.  Screening for breast cancer - Discussed current guidelines and importance of preventative screenings. Information provided on local imaging centers. Normal breast exam today.  Return in about 1 year (around 11/29/2025) for Annual.   Stacey DELENA Shutter DNP, 10:18 AM 11/29/2024 "

## 2024-12-06 ENCOUNTER — Ambulatory Visit (INDEPENDENT_AMBULATORY_CARE_PROVIDER_SITE_OTHER)

## 2024-12-06 DIAGNOSIS — Z3042 Encounter for surveillance of injectable contraceptive: Secondary | ICD-10-CM

## 2024-12-06 MED ORDER — MEDROXYPROGESTERONE ACETATE 150 MG/ML IM SUSY
150.0000 mg | PREFILLED_SYRINGE | Freq: Once | INTRAMUSCULAR | Status: AC
  Administered 2024-12-06: 150 mg via INTRAMUSCULAR

## 2024-12-06 NOTE — Progress Notes (Signed)
 Medroxyprogesterone  acetate 150 mg injection given IM RUOQ.  Patient tolerated injection well.  Next injection is due 02/21/25-03/07/25.  Last AEX 11/29/24 Tiffany, NP

## 2025-02-23 ENCOUNTER — Ambulatory Visit
